# Patient Record
Sex: Female | Born: 1965 | Race: White | Hispanic: No | Marital: Married | Smoking: Never smoker
Health system: Southern US, Community
[De-identification: ages and names within clinical notes are randomized; demographics above are authoritative.]

## PROBLEM LIST (undated history)

## (undated) DIAGNOSIS — G5600 Carpal tunnel syndrome, unspecified upper limb: Secondary | ICD-10-CM

## (undated) DIAGNOSIS — R32 Unspecified urinary incontinence: Secondary | ICD-10-CM

## (undated) DIAGNOSIS — T753XXA Motion sickness, initial encounter: Secondary | ICD-10-CM

## (undated) DIAGNOSIS — M1990 Unspecified osteoarthritis, unspecified site: Secondary | ICD-10-CM

## (undated) DIAGNOSIS — K219 Gastro-esophageal reflux disease without esophagitis: Secondary | ICD-10-CM

## (undated) DIAGNOSIS — Z5189 Encounter for other specified aftercare: Secondary | ICD-10-CM

## (undated) HISTORY — PX: LEG SURGERY: SHX1003

## (undated) HISTORY — DX: Encounter for other specified aftercare: Z51.89

## (undated) HISTORY — PX: BREAST ENHANCEMENT SURGERY: SHX7

## (undated) HISTORY — PX: AUGMENTATION MAMMAPLASTY: SUR837

## (undated) HISTORY — PX: DILATION AND CURETTAGE OF UTERUS: SHX78

## (undated) HISTORY — PX: BARTHOLIN GLAND CYST EXCISION: SHX565

## (undated) HISTORY — DX: Unspecified urinary incontinence: R32

---

## 1999-10-22 ENCOUNTER — Other Ambulatory Visit: Admission: RE | Admit: 1999-10-22 | Discharge: 1999-10-22 | Payer: Self-pay | Admitting: Gynecology

## 2001-02-20 ENCOUNTER — Other Ambulatory Visit: Admission: RE | Admit: 2001-02-20 | Discharge: 2001-02-20 | Payer: Self-pay | Admitting: Gynecology

## 2002-04-12 ENCOUNTER — Other Ambulatory Visit: Admission: RE | Admit: 2002-04-12 | Discharge: 2002-04-12 | Payer: Self-pay | Admitting: Gynecology

## 2015-10-17 ENCOUNTER — Encounter: Payer: Self-pay | Admitting: *Deleted

## 2015-10-24 ENCOUNTER — Ambulatory Visit
Admission: RE | Admit: 2015-10-24 | Discharge: 2015-10-24 | Disposition: A | Discharge status: Home / Self Care | Payer: BLUE CROSS/BLUE SHIELD | Source: Ambulatory Visit | Attending: Unknown Physician Specialty | Admitting: Unknown Physician Specialty

## 2015-10-24 ENCOUNTER — Ambulatory Visit: Payer: BLUE CROSS/BLUE SHIELD | Admitting: Anesthesiology

## 2015-10-24 ENCOUNTER — Encounter
Admission: RE | Disposition: A | Discharge status: Home / Self Care | Payer: Self-pay | Source: Ambulatory Visit | Attending: Unknown Physician Specialty

## 2015-10-24 DIAGNOSIS — G5602 Carpal tunnel syndrome, left upper limb: Secondary | ICD-10-CM | POA: Insufficient documentation

## 2015-10-24 DIAGNOSIS — Z9889 Other specified postprocedural states: Secondary | ICD-10-CM | POA: Insufficient documentation

## 2015-10-24 DIAGNOSIS — Z8261 Family history of arthritis: Secondary | ICD-10-CM | POA: Diagnosis not present

## 2015-10-24 DIAGNOSIS — Z8249 Family history of ischemic heart disease and other diseases of the circulatory system: Secondary | ICD-10-CM | POA: Insufficient documentation

## 2015-10-24 HISTORY — DX: Motion sickness, initial encounter: T75.3XXA

## 2015-10-24 HISTORY — PX: TRIGGER FINGER RELEASE: SHX641

## 2015-10-24 HISTORY — DX: Carpal tunnel syndrome, unspecified upper limb: G56.00

## 2015-10-24 HISTORY — PX: CARPAL TUNNEL RELEASE: SHX101

## 2015-10-24 SURGERY — CARPAL TUNNEL RELEASE
Anesthesia: Monitor Anesthesia Care | Site: Hand | Laterality: Right | Wound class: Clean

## 2015-10-24 MED ORDER — FENTANYL CITRATE (PF) 100 MCG/2ML IJ SOLN
INTRAMUSCULAR | Status: DC | PRN
  Administered 2015-10-24 (×2): 50 ug via INTRAVENOUS
  Administered 2015-10-24: 100 ug via INTRAVENOUS

## 2015-10-24 MED ORDER — NORCO 5-325 MG PO TABS: 1.0000 | ORAL_TABLET | Freq: Four times a day (QID) | ORAL | Status: DC | PRN

## 2015-10-24 MED ORDER — LIDOCAINE HCL (PF) 1 % IJ SOLN
INTRAMUSCULAR | Status: DC | PRN
  Administered 2015-10-24: 10 mL

## 2015-10-24 MED ORDER — MEPERIDINE HCL 25 MG/ML IJ SOLN: 6.2500 mg | INTRAMUSCULAR | Status: DC | PRN

## 2015-10-24 MED ORDER — OXYCODONE HCL 5 MG PO TABS
5.0000 mg | ORAL_TABLET | Freq: Once | ORAL | Status: DC | PRN
Start: 2015-10-24 — End: 2015-10-24

## 2015-10-24 MED ORDER — LIDOCAINE HCL (CARDIAC) 20 MG/ML IV SOLN
INTRAVENOUS | Status: DC | PRN
  Administered 2015-10-24: 50 mg via INTRAVENOUS

## 2015-10-24 MED ORDER — HYDROMORPHONE HCL 1 MG/ML IJ SOLN: 0.2500 mg | INTRAMUSCULAR | Status: DC | PRN

## 2015-10-24 MED ORDER — OXYCODONE HCL 5 MG/5ML PO SOLN: 5.0000 mg | Freq: Once | ORAL | Status: DC | PRN

## 2015-10-24 MED ORDER — PROMETHAZINE HCL 25 MG/ML IJ SOLN: 6.2500 mg | INTRAMUSCULAR | Status: DC | PRN

## 2015-10-24 MED ORDER — PROPOFOL 500 MG/50ML IV EMUL
INTRAVENOUS | Status: DC | PRN
Start: 2015-10-24 — End: 2015-10-24
  Administered 2015-10-24: 120 ug/kg/min via INTRAVENOUS

## 2015-10-24 MED ORDER — ROPIVACAINE HCL 5 MG/ML IJ SOLN
INTRAMUSCULAR | Status: DC | PRN
  Administered 2015-10-24: 200 mg via EPIDURAL

## 2015-10-24 MED ORDER — LACTATED RINGERS IV SOLN
INTRAVENOUS | Status: DC
  Administered 2015-10-24: 08:00:00 via INTRAVENOUS

## 2015-10-24 MED ORDER — MIDAZOLAM HCL 2 MG/2ML IJ SOLN
INTRAMUSCULAR | Status: DC | PRN
  Administered 2015-10-24 (×2): 2 mg via INTRAVENOUS

## 2015-10-24 SURGICAL SUPPLY — 30 items
BANDAGE CONFORM 2X5YD N/S (GAUZE/BANDAGES/DRESSINGS) ×4 IMPLANT
BANDAGE ELASTIC 2 CLIP NS LF (GAUZE/BANDAGES/DRESSINGS) ×8 IMPLANT
BNDG ESMARK 4X12 TAN STRL LF (GAUZE/BANDAGES/DRESSINGS) ×4 IMPLANT
COVER LIGHT HANDLE FLEXIBLE (MISCELLANEOUS) ×8 IMPLANT
CUFF TOURN SGL QUICK 18 (TOURNIQUET CUFF) ×8 IMPLANT
DRAPE EENT SPLIT AURORA (DRAPES) ×4 IMPLANT
DRAPE SHEET LG 3/4 BI-LAMINATE (DRAPES) ×4 IMPLANT
DURAPREP 26ML APPLICATOR (WOUND CARE) ×8 IMPLANT
GAUZE SPONGE 4X4 12PLY STRL (GAUZE/BANDAGES/DRESSINGS) ×4 IMPLANT
GLOVE BIO SURGEON STRL SZ7.5 (GLOVE) ×8 IMPLANT
GLOVE BIO SURGEON STRL SZ8 (GLOVE) ×4 IMPLANT
GLOVE INDICATOR 8.0 STRL GRN (GLOVE) ×8 IMPLANT
GOWN STRL REUS W/ TWL LRG LVL3 (GOWN DISPOSABLE) ×4 IMPLANT
GOWN STRL REUS W/TWL LRG LVL3 (GOWN DISPOSABLE) ×6
KIT ROOM TURNOVER OR (KITS) ×4 IMPLANT
LOOP VESSEL RED MINI 1.3X0.9 (MISCELLANEOUS) IMPLANT
LOOPS RED MINI 1.3MMX0.9MM (MISCELLANEOUS)
NS IRRIG 500ML POUR BTL (IV SOLUTION) ×4 IMPLANT
PACK EXTREMITY ARMC (MISCELLANEOUS) ×4 IMPLANT
PAD GROUND ADULT SPLIT (MISCELLANEOUS) ×4 IMPLANT
PADDING CAST 2X4YD ST (MISCELLANEOUS) ×4
PADDING CAST BLEND 2X4 STRL (MISCELLANEOUS) ×4 IMPLANT
SOL PREP PVP 2OZ (MISCELLANEOUS) ×4
SOLUTION PREP PVP 2OZ (MISCELLANEOUS) ×2 IMPLANT
SPLINT CAST 1 STEP 3X12 (MISCELLANEOUS) ×4 IMPLANT
STOCKINETTE 4X48 STRL (DRAPES) ×8 IMPLANT
STRAP BODY AND KNEE 60X3 (MISCELLANEOUS) ×4 IMPLANT
SUT ETHILON 4-0 (SUTURE) ×8
SUT ETHILON 4-0 FS2 18XMFL BLK (SUTURE) ×4
SUTURE ETHLN 4-0 FS2 18XMF BLK (SUTURE) ×4 IMPLANT

## 2015-10-24 NOTE — Progress Notes (Signed)
Assisted Dr. Friedman with left, supraclavicular block. Side rails up, monitors on throughout procedure. See vital signs in flow sheet. Tolerated Procedure well.  

## 2015-10-24 NOTE — Anesthesia Postprocedure Evaluation (Signed)
Anesthesia Post Note  Patient: Hayley Jacobson  Procedure(s) Performed: Procedure(s) (LRB): LEFT OPEN CARPAL TUNNEL RELEASE (Left) RIGHT HAND MIDDLE RELEASE TRIGGER FINGER/A-1 PULLEY (Right)  Patient location during evaluation: PACU Anesthesia Type: Regional and MAC Level of consciousness: awake and alert and oriented Pain management: pain level controlled Vital Signs Assessment: post-procedure vital signs reviewed and stable Respiratory status: spontaneous breathing and nonlabored ventilation Cardiovascular status: stable Postop Assessment: no signs of nausea or vomiting and adequate PO intake Anesthetic complications: no    Estill Batten

## 2015-10-24 NOTE — H&P (Signed)
  H and P reviewed. No changes. Uploaded at later date. 

## 2015-10-24 NOTE — Op Note (Signed)
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 Leanor Kail, Brooke Bonito. M.D.  ANESTHESIA: Supraclavicular block for the left wrist surgery and local anesthesia for the right middle finger surgery   INDICATIONS FOR SURGERY: Hayley Jacobson is a 50 y.o. year old female with a long history of numbness and paresthesias in the left hand. Nerve conduction studies demonstrated findings consistent with significant  median nerve compression.The patient had not seen any significant improvement despite conservative nonsurgical intervention. After discussion of the risks and benefits of surgical intervention, the patient expressed understanding of the risks benefits and agree with plans for carpal tunnel release.   PROCEDURE IN DETAIL: The patient was brought into the operating room after a supraclavicular nerve block on the left upper extremity had been performed. A tourniquet was placed on the patient's left upper arm.The left hand and arm were prepped  and draped in the usual sterile fashion. A "time-out" was performed as per usual protocol. The hand and forearm were exsanguinated using an Esmarch and the tourniquet was inflated to 250 mmHg.  An incision was made just ulnar to the thenar palmar crease. Dissection was carried down through the palmar fascia to the transverse carpal ligament. The transverse carpal ligament was sharply incised, taking care to protect the underlying structures within the carpal tunnel. Complete release of the transverse carpal ligament was achieved. There was no evidence of a mass or proliferative synovitis within the carpal tunnel. The wound was irrigated with  saline. The tourniquet was released at this time. It had been up for about 9 minutes. Bleeding was controlled with coagulation cautery.. The skin was then re-approximated with interrupted sutures of #4-0 nylon. A sterile dressing was applied followed by application of a volar splint.  I then turned my attention to the right hand. The right hand was prepped and draped in usual fashion for a procedure about the right upper extremity. In this case the tourniquet was on the forearm since there was an IV in the antecubital fossa. The right hand and forearm were exsanguinated and the tourniquet was inflated. I made about a centimeter and a half incision over the volar aspect of the MCP joint of the right middle finger in the area that had previously been anesthetized with a local block consisting of 10 cc of 1% Xylocaine. I bluntly dissected down to the flexor sheath. Care was taken to protect the digital nerves. I then opened the stenosed portion of the flexor sheath longitudinally. It was felt that the stenotic segment was completely decompressed. The middle finger flexor tendon moved freely at this time with no catching or popping. The tourniquet was released at this time. It was up about 6 minutes. Bleeding was controlled with digital pressure. The wound was closed with 4-0 nylon sutures in vertical mattress fashion and then a sterile dressing was applied.  The patient tolerated the procedures well and was transported to the PACU in stable condition.  Dr. Mariel Kansky. M.D.

## 2015-10-24 NOTE — Discharge Instructions (Signed)
General Anesthesia, Adult, Care After Refer to this sheet in the next few weeks. These instructions provide you with information on caring for yourself after your procedure. Your health care provider may also give you more specific instructions. Your treatment has been planned according to current medical practices, but problems sometimes occur. Call your health care provider if you have any problems or questions after your procedure. WHAT TO EXPECT AFTER THE PROCEDURE After the procedure, it is typical to experience:  Sleepiness.  Nausea and vomiting. HOME CARE INSTRUCTIONS  For the first 24 hours after general anesthesia:  Have a responsible person with you.  Do not drive a car. If you are alone, do not take public transportation.  Do not drink alcohol.  Do not take medicine that has not been prescribed by your health care provider.  Do not sign important papers or make important decisions.  You may resume a normal diet and activities as directed by your health care provider.  Change bandages (dressings) as directed.  If you have questions or problems that seem related to general anesthesia, call the hospital and ask for the anesthetist or anesthesiologist on call. SEEK MEDICAL CARE IF:  You have nausea and vomiting that continue the day after anesthesia.  You develop a rash. SEEK IMMEDIATE MEDICAL CARE IF:   You have difficulty breathing.  You have chest pain.  You have any allergic problems.   This information is not intended to replace advice given to you by your health care provider. Make sure you discuss any questions you have with your health care provider.   Document Released: 12/13/2000 Document Revised: 09/27/2014 Document Reviewed: 01/05/2012 Elsevier Interactive Patient Education 2016 [employer]  Elevation  RTC in about 10 days  Ice pack  Keep wounds dry

## 2015-10-24 NOTE — Transfer of Care (Signed)
Immediate Anesthesia Transfer of Care Note  Patient: Hayley Jacobson  Procedure(s) Performed: Procedure(s): LEFT OPEN CARPAL TUNNEL RELEASE (Left) RIGHT HAND MIDDLE RELEASE TRIGGER FINGER/A-1 PULLEY (Right)  Patient Location: PACU  Anesthesia Type: Regional, MAC  Level of Consciousness: awake, alert  and patient cooperative  Airway and Oxygen Therapy: Patient Spontanous Breathing and Patient connected to supplemental oxygen  Post-op Assessment: Post-op Vital signs reviewed, Patient's Cardiovascular Status Stable, Respiratory Function Stable, Patent Airway and No signs of Nausea or vomiting  Post-op Vital Signs: Reviewed and stable  Complications: No apparent anesthesia complications

## 2015-10-24 NOTE — Anesthesia Procedure Notes (Addendum)
Anesthesia Regional Block:  Supraclavicular block  Pre-Anesthetic Checklist: ,, timeout performed, Correct Patient, Correct Site, Correct Laterality, Correct Procedure,, site marked, risks and benefits discussed, Surgical consent, Pre-op evaluation,  At surgeon's request  Laterality: Left  Prep: chloraprep       Needles:  Injection technique: Single-shot     Needle Length: 9cm 9 cm Needle Gauge: 21 and 21 G    Additional Needles: Supraclavicular block Narrative:  Start time: 10/24/2015 8:20 AM End time: 10/24/2015 8:25 AM Injection made incrementally with aspirations every 5 mL.  Performed by: Personally  Anesthesiologist: Estill Batten   Procedure Name: MAC Date/Time: 10/24/2015 9:30 AM Performed by: Cameron Ali Pre-anesthesia Checklist: Patient identified, Emergency Drugs available, Suction available, Timeout performed and Patient being monitored Patient Re-evaluated:Patient Re-evaluated prior to inductionOxygen Delivery Method: Nasal cannula Placement Confirmation: positive ETCO2

## 2015-10-24 NOTE — Anesthesia Preprocedure Evaluation (Signed)
Anesthesia Evaluation  Patient identified by MRN, date of birth, ID band  Reviewed: Allergy & Precautions, NPO status , Patient's Chart, lab work & pertinent test results, reviewed documented beta blocker date and time   Airway Mallampati: I  TM Distance: >3 FB Neck ROM: Full    Dental no notable dental hx.    Pulmonary neg pulmonary ROS,    Pulmonary exam normal        Cardiovascular negative cardio ROS Normal cardiovascular exam     Neuro/Psych  Neuromuscular disease    GI/Hepatic negative GI ROS, Neg liver ROS,   Endo/Other  negative endocrine ROS  Renal/GU negative Renal ROS  negative genitourinary   Musculoskeletal negative musculoskeletal ROS (+)   Abdominal   Peds negative pediatric ROS (+)  Hematology negative hematology ROS (+)   Anesthesia Other Findings   Reproductive/Obstetrics negative OB ROS                             Anesthesia Physical Anesthesia Plan  ASA: I  Anesthesia Plan: Regional and MAC   Post-op Pain Management:    Induction:   Airway Management Planned:   Additional Equipment:   Intra-op Plan:   Post-operative Plan:   Informed Consent: I have reviewed the patients History and Physical, chart, labs and discussed the procedure including the risks, benefits and alternatives for the proposed anesthesia with the patient or authorized representative who has indicated his/her understanding and acceptance.     Plan Discussed with: CRNA  Anesthesia Plan Comments:         Anesthesia Quick Evaluation

## 2015-10-27 ENCOUNTER — Encounter: Payer: Self-pay | Admitting: Unknown Physician Specialty

## 2016-05-04 ENCOUNTER — Other Ambulatory Visit: Payer: Self-pay | Admitting: Surgery

## 2016-05-04 DIAGNOSIS — M25511 Pain in right shoulder: Secondary | ICD-10-CM

## 2016-05-04 DIAGNOSIS — M7581 Other shoulder lesions, right shoulder: Secondary | ICD-10-CM

## 2016-05-18 ENCOUNTER — Ambulatory Visit
Admission: RE | Admit: 2016-05-18 | Discharge: 2016-05-18 | Disposition: A | Discharge status: Home / Self Care | Payer: BLUE CROSS/BLUE SHIELD | Source: Ambulatory Visit | Attending: Surgery | Admitting: Surgery

## 2016-05-18 DIAGNOSIS — M7591 Shoulder lesion, unspecified, right shoulder: Secondary | ICD-10-CM | POA: Diagnosis not present

## 2016-05-18 DIAGNOSIS — M25511 Pain in right shoulder: Secondary | ICD-10-CM | POA: Insufficient documentation

## 2016-05-18 DIAGNOSIS — M7581 Other shoulder lesions, right shoulder: Secondary | ICD-10-CM | POA: Insufficient documentation

## 2016-05-18 DIAGNOSIS — M19011 Primary osteoarthritis, right shoulder: Secondary | ICD-10-CM | POA: Diagnosis not present

## 2016-05-29 LAB — HM COLONOSCOPY

## 2016-09-02 ENCOUNTER — Encounter: Payer: Self-pay | Admitting: *Deleted

## 2016-09-08 ENCOUNTER — Ambulatory Visit
Admission: RE | Admit: 2016-09-08 | Discharge: 2016-09-08 | Disposition: A | Discharge status: Home / Self Care | Payer: Managed Care, Other (non HMO) | Source: Ambulatory Visit | Attending: Surgery | Admitting: Surgery

## 2016-09-08 ENCOUNTER — Ambulatory Visit: Payer: Managed Care, Other (non HMO) | Admitting: Anesthesiology

## 2016-09-08 ENCOUNTER — Encounter
Admission: RE | Disposition: A | Discharge status: Home / Self Care | Payer: Self-pay | Source: Ambulatory Visit | Attending: Surgery

## 2016-09-08 DIAGNOSIS — M65811 Other synovitis and tenosynovitis, right shoulder: Secondary | ICD-10-CM | POA: Insufficient documentation

## 2016-09-08 DIAGNOSIS — Z8249 Family history of ischemic heart disease and other diseases of the circulatory system: Secondary | ICD-10-CM | POA: Diagnosis not present

## 2016-09-08 DIAGNOSIS — Z888 Allergy status to other drugs, medicaments and biological substances status: Secondary | ICD-10-CM | POA: Diagnosis not present

## 2016-09-08 DIAGNOSIS — M659 Synovitis and tenosynovitis, unspecified: Secondary | ICD-10-CM | POA: Diagnosis present

## 2016-09-08 DIAGNOSIS — M7541 Impingement syndrome of right shoulder: Secondary | ICD-10-CM | POA: Insufficient documentation

## 2016-09-08 DIAGNOSIS — Z8261 Family history of arthritis: Secondary | ICD-10-CM | POA: Insufficient documentation

## 2016-09-08 HISTORY — PX: SHOULDER ARTHROSCOPY: SHX128

## 2016-09-08 SURGERY — ARTHROSCOPY, SHOULDER
Anesthesia: Monitor Anesthesia Care | Laterality: Right | Wound class: Clean

## 2016-09-08 MED ORDER — MIDAZOLAM HCL 2 MG/2ML IJ SOLN
INTRAMUSCULAR | Status: DC | PRN
  Administered 2016-09-08: 2 mg via INTRAVENOUS

## 2016-09-08 MED ORDER — LIDOCAINE HCL (CARDIAC) 20 MG/ML IV SOLN
INTRAVENOUS | Status: DC | PRN
  Administered 2016-09-08: 20 mg via INTRAVENOUS

## 2016-09-08 MED ORDER — LACTATED RINGERS IV SOLN
INTRAVENOUS | Status: DC
  Administered 2016-09-08: 12:00:00 via INTRAVENOUS

## 2016-09-08 MED ORDER — BUPIVACAINE-EPINEPHRINE 0.25% -1:200000 IJ SOLN
INTRAMUSCULAR | Status: DC | PRN
  Administered 2016-09-08: 30 mL

## 2016-09-08 MED ORDER — OXYCODONE HCL 5 MG PO TABS: 5.0000 mg | ORAL_TABLET | ORAL | 0 refills | Status: DC | PRN

## 2016-09-08 MED ORDER — SCOPOLAMINE 1 MG/3DAYS TD PT72
1.0000 | MEDICATED_PATCH | Freq: Once | TRANSDERMAL | Status: DC
  Administered 2016-09-08: 1.5 mg via TRANSDERMAL

## 2016-09-08 MED ORDER — FENTANYL CITRATE (PF) 100 MCG/2ML IJ SOLN
INTRAMUSCULAR | Status: DC | PRN
  Administered 2016-09-08: 100 ug via INTRAVENOUS

## 2016-09-08 MED ORDER — DEXTROSE 5 % IV SOLN
2000.0000 mg | Freq: Once | INTRAVENOUS | Status: AC
  Administered 2016-09-08: 2000 mg via INTRAVENOUS

## 2016-09-08 MED ORDER — PROPOFOL 500 MG/50ML IV EMUL
INTRAVENOUS | Status: DC | PRN
  Administered 2016-09-08: 100 ug/kg/min via INTRAVENOUS

## 2016-09-08 MED ORDER — ROPIVACAINE HCL 5 MG/ML IJ SOLN
INTRAMUSCULAR | Status: DC | PRN
  Administered 2016-09-08: 35 mL via PERINEURAL

## 2016-09-08 SURGICAL SUPPLY — 36 items
BIT DRILL JUGRKNT W/NDL BIT2.9 (DRILL) IMPLANT
BLADE FULL RADIUS 3.5 (BLADE) ×3 IMPLANT
BUR ACROMIONIZER 4.0 (BURR) ×3 IMPLANT
CANNULA SHAVER 8MMX76MM (CANNULA) ×3 IMPLANT
CHLORAPREP W/TINT 26ML (MISCELLANEOUS) ×3 IMPLANT
COVER LIGHT HANDLE UNIVERSAL (MISCELLANEOUS) ×6 IMPLANT
COVER MAYO STAND STRL (DRAPES) ×3 IMPLANT
DRAPE IMP U-DRAPE 54X76 (DRAPES) ×6 IMPLANT
DRILL JUGGERKNOT W/NDL BIT 2.9 (DRILL)
GAUZE PETRO XEROFOAM 1X8 (MISCELLANEOUS) ×3 IMPLANT
GAUZE SPONGE 4X4 12PLY STRL (GAUZE/BANDAGES/DRESSINGS) ×3 IMPLANT
GLOVE BIO SURGEON STRL SZ8 (GLOVE) ×6 IMPLANT
GLOVE INDICATOR 8.0 STRL GRN (GLOVE) ×3 IMPLANT
GOWN STRL REUS W/ TWL LRG LVL3 (GOWN DISPOSABLE) ×1 IMPLANT
GOWN STRL REUS W/ TWL XL LVL3 (GOWN DISPOSABLE) ×1 IMPLANT
GOWN STRL REUS W/TWL LRG LVL3 (GOWN DISPOSABLE) ×3
GOWN STRL REUS W/TWL XL LVL3 (GOWN DISPOSABLE) ×3
IV LACTATED RINGER IRRG 3000ML (IV SOLUTION) ×3
IV LR IRRIG 3000ML ARTHROMATIC (IV SOLUTION) ×1 IMPLANT
MANIFOLD 4PT FOR NEPTUNE1 (MISCELLANEOUS) ×3 IMPLANT
MAT BLUE FLOOR 46X72 FLO (MISCELLANEOUS) ×3 IMPLANT
NEEDLE HYPO 21X1.5 SAFETY (NEEDLE) ×3 IMPLANT
NEEDLE REVERSE CUT 1/2 CRC (NEEDLE) ×3 IMPLANT
PACK ARTHROSCOPY SHOULDER (MISCELLANEOUS) ×3 IMPLANT
PAD GROUND ADULT SPLIT (MISCELLANEOUS) ×3 IMPLANT
SLING ARM M TX990204 (SOFTGOODS) ×3 IMPLANT
STAPLER SKIN PROX 35W (STAPLE) ×3 IMPLANT
STRAP BODY AND KNEE 60X3 (MISCELLANEOUS) ×6 IMPLANT
SUT ETHIBOND 0 MO6 C/R (SUTURE) IMPLANT
SUT VIC AB 2-0 CT1 27 (SUTURE)
SUT VIC AB 2-0 CT1 TAPERPNT 27 (SUTURE) IMPLANT
TAPE MICROFOAM 4IN (TAPE) ×3 IMPLANT
TUBING ARTHRO INFLOW-ONLY STRL (TUBING) ×3 IMPLANT
TUBING CONNECTING 10 (TUBING) ×2 IMPLANT
TUBING CONNECTING 10' (TUBING) ×1
WAND HAND CNTRL MULTIVAC 90 (MISCELLANEOUS) ×3 IMPLANT

## 2016-09-08 NOTE — Anesthesia Procedure Notes (Signed)
Anesthesia Regional Block:  Interscalene brachial plexus block  Pre-Anesthetic Checklist: ,, timeout performed, Correct Patient, Correct Site, Correct Laterality, Correct Procedure, Correct Position, site marked, Risks and benefits discussed, Surgical consent,  Pre-op evaluation,  At surgeon's request  Laterality: Right  Prep: chloraprep       Needles:  Injection technique: Single-shot  Needle Type: Stimiplex     Needle Length: 10cm 10 cm Needle Gauge: 21 and 21 G    Additional Needles:  Procedures: ultrasound guided (picture in chart) Interscalene brachial plexus block Narrative:  Start time: 09/08/2016 11:34 AM End time: 09/08/2016 11:41 AM Injection made incrementally with aspirations every 5 mL.  Performed by: Personally  Anesthesiologist: Ronelle Nigh  Additional Notes: Functioning IV was confirmed and monitors applied. Ultrasound guidance: relevant anatomy identified, needle position confirmed, local anesthetic spread visualized around nerve(s)., vascular puncture avoided.  Image printed for medical record.  Negative aspiration and no paresthesias; incremental administration of local anesthetic. The patient tolerated the procedure well. Vitals signes recorded in RN notes.

## 2016-09-08 NOTE — Anesthesia Procedure Notes (Signed)
Procedure Name: MAC Performed by: Kalika Smay Pre-anesthesia Checklist: Patient identified, Emergency Drugs available, Suction available, Timeout performed and Patient being monitored Patient Re-evaluated:Patient Re-evaluated prior to inductionOxygen Delivery Method: Simple face mask Placement Confirmation: positive ETCO2       

## 2016-09-08 NOTE — Progress Notes (Signed)
Assisted Mike Stella ANMD with right, ultrasound guided, supraclavicular block. Side rails up, monitors on throughout procedure. See vital signs in flow sheet. Tolerated Procedure well.  

## 2016-09-08 NOTE — Transfer of Care (Signed)
Immediate Anesthesia Transfer of Care Note  Patient: Hayley Jacobson  Procedure(s) Performed: Procedure(s): ARTHROSCOPY SHOULDER WITH DEBRIDEMENT, DECOMPRESSION (Right)  Patient Location: PACU  Anesthesia Type: MAC, Regional  Level of Consciousness: awake, alert  and patient cooperative  Airway and Oxygen Therapy: Patient Spontanous Breathing and Patient connected to supplemental oxygen  Post-op Assessment: Post-op Vital signs reviewed, Patient's Cardiovascular Status Stable, Respiratory Function Stable, Patent Airway and No signs of Nausea or vomiting  Post-op Vital Signs: Reviewed and stable  Complications: No apparent anesthesia complications

## 2016-09-08 NOTE — Anesthesia Preprocedure Evaluation (Signed)
Anesthesia Evaluation  Patient identified by MRN, date of birth, ID band Patient awake    Reviewed: Allergy & Precautions, H&P , NPO status , Patient's Chart, lab work & pertinent test results  Airway Mallampati: I  TM Distance: >3 FB Neck ROM: full    Dental no notable dental hx.    Pulmonary    Pulmonary exam normal        Cardiovascular Normal cardiovascular exam     Neuro/Psych    GI/Hepatic   Endo/Other    Renal/GU      Musculoskeletal   Abdominal   Peds  Hematology   Anesthesia Other Findings   Reproductive/Obstetrics                             Anesthesia Physical Anesthesia Plan  ASA: I  Anesthesia Plan: MAC and Regional   Post-op Pain Management:    Induction:   Airway Management Planned:   Additional Equipment:   Intra-op Plan:   Post-operative Plan:   Informed Consent: I have reviewed the patients History and Physical, chart, labs and discussed the procedure including the risks, benefits and alternatives for the proposed anesthesia with the patient or authorized representative who has indicated his/her understanding and acceptance.     Plan Discussed with:   Anesthesia Plan Comments:         Anesthesia Quick Evaluation

## 2016-09-08 NOTE — H&P (Signed)
Paper H&P to be scanned into permanent record. H&P reviewed. No changes. 

## 2016-09-08 NOTE — Anesthesia Postprocedure Evaluation (Signed)
Anesthesia Post Note  Patient: Hayley Jacobson  Procedure(s) Performed: Procedure(s) (LRB): ARTHROSCOPY SHOULDER WITH DEBRIDEMENT, DECOMPRESSION (Right)  Patient location during evaluation: PACU Anesthesia Type: Regional and MAC Level of consciousness: awake and alert and oriented Pain management: satisfactory to patient Vital Signs Assessment: post-procedure vital signs reviewed and stable Respiratory status: spontaneous breathing, nonlabored ventilation and respiratory function stable Cardiovascular status: blood pressure returned to baseline and stable Postop Assessment: Adequate PO intake and No signs of nausea or vomiting Anesthetic complications: no    Raliegh Ip

## 2016-09-08 NOTE — Discharge Instructions (Addendum)
General Anesthesia, Adult, Care After These instructions provide you with information about caring for yourself after your procedure. Your health care provider may also give you more specific instructions. Your treatment has been planned according to current medical practices, but problems sometimes occur. Call your health care provider if you have any problems or questions after your procedure. What can I expect after the procedure? After the procedure, it is common to have:  Vomiting.  A sore throat.  Mental slowness. It is common to feel:  Nauseous.  Cold or shivery.  Sleepy.  Tired.  Sore or achy, even in parts of your body where you did not have surgery. Follow these instructions at home: For at least 24 hours after the procedure:  Do not:  Participate in activities where you could fall or become injured.  Drive.  Use heavy machinery.  Drink alcohol.  Take sleeping pills or medicines that cause drowsiness.  Make important decisions or sign legal documents.  Take care of children on your own.  Rest. Eating and drinking  If you vomit, drink water, juice, or soup when you can drink without vomiting.  Drink enough fluid to keep your urine clear or pale yellow.  Make sure you have little or no nausea before eating solid foods.  Follow the diet recommended by your health care provider. General instructions  Have a responsible adult stay with you until you are awake and alert.  Return to your normal activities as told by your health care provider. Ask your health care provider what activities are safe for you.  Take over-the-counter and prescription medicines only as told by your health care provider.  If you smoke, do not smoke without supervision.  Keep all follow-up visits as told by your health care provider. This is important. Contact a health care provider if:  You continue to have nausea or vomiting at home, and medicines are not helpful.  You  cannot drink fluids or start eating again.  You cannot urinate after 8-12 hours.  You develop a skin rash.  You have fever.  You have increasing redness at the site of your procedure. Get help right away if:  You have difficulty breathing.  You have chest pain.  You have unexpected bleeding.  You feel that you are having a life-threatening or urgent problem. This information is not intended to replace advice given to you by your health care provider. Make sure you discuss any questions you have with your health care provider. Document Released: 12/13/2000 Document Revised: 02/09/2016 Document Reviewed: 08/21/2015 Elsevier Interactive Patient Education  2017 [place] dressing dry and intact.  May shower after dressing changed on post-op day #4 (Sunday).  Cover staples with Band-Aids after drying off. Apply ice frequently to shoulder. Take ibuprofen 600-800 mg TID with meals for 7-10 days, then as necessary. Take oxycodone as prescribed when needed.  May supplement with ES Tylenol if necessary. Keep shoulder sling on for first 3 days, then as needed for comfort. Follow-up in 10-14 days or as scheduled.

## 2016-09-08 NOTE — Op Note (Signed)
09/08/2016  1:51 PM  Patient:   Hayley Jacobson  Pre-Op Diagnosis:   Impingement/tendinopathy, right shoulder.  Postoperative diagnosis: Impingement/tendinopathy with synovitis, right shoulder.  Procedure: Limited arthroscopic debridement and arthroscopic subacromial decompression, right shoulder.  Anesthesia: IV sedation with interscalene block placed preoperatively by the anesthesiologist.  Surgeon:   Pascal Lux, MD  Assistant:   None  Findings: As above. The biceps tendon demonstrated an area of mild focal erythema but otherwise was intact. The labrum demonstrated minimal fraying anteriorly but also was otherwise intact. The rotator cuff was in satisfactory condition, as were the articular surfaces of both the glenoid and humerus.  Complications: None  Fluids:   900 cc  Estimated blood loss: 5 cc  Tourniquet time: None  Drains: None  Closure: Staples   Brief clinical note: The patient is a 50 year old female with a history of right shoulder pain. The patient's symptoms have progressed despite medications, activity modification, etc. The patient's history and examination are consistent with impingement/tendinopathy. A recent MRI scan demonstrated no evidence for partial full-thickness tears of the rotator cuff. The patient presents at this time for arthroscopy, debridement, and subacromial decompression.  Procedure: The patient underwent placement of an interscalene block by the anesthesiologist in the preoperative holding area before she was brought into the operating room and lain in the supine position. After adequate IV sedation was achieved, the patient was repositioned in the beach chair position using the beach chair positioner. The right shoulder and upper extremity were prepped with ChloraPrep solution before being draped sterilely. Preoperative antibiotics were administered. A timeout was performed to confirm the proper surgical site  before the expected portal sites and incision site were injected with 0.25% Sensorcaine with epinephrine. A posterior portal was created and the glenohumeral joint thoroughly inspected with the findings as described above. An anterior portal was created using an outside-in technique. The labrum and rotator cuff were further probed, again confirming the above-noted findings. Areas of extensive synovitis anteriorly and superiorly were debrided back to stable margins using the full-radius resector, as was the small area of focal fraying of the anterior portion of the labrum. The ArthroCare wand was inserted and used to obtain hemostasis as well as to "anneal" the labrum superiorly and anteriorly. The instruments were removed from the joint after suctioning the excess fluid.  The camera was repositioned through the posterior portal into the subacromial space. A separate lateral portal was created using an outside-in technique. The 3.5 mm full-radius resector was introduced and used to perform a subtotal bursectomy. The ArthroCare wand was then inserted and used to remove the periosteal tissue off the undersurface of the anterior third of the acromion as well as to recess the coracoacromial ligament from its attachment along the anterior and lateral margins of the acromion. The 4.0 mm acromionizing bur was introduced and used to complete the decompression by removing the undersurface of the anterior third of the acromion. The full radius resector was reintroduced to remove any residual bony debris before the ArthroCare wand was reintroduced to obtain hemostasis. The instruments were then removed from the subacromial space after suctioning the excess fluid.  The portal sites were closed using staples. A sterile bulky dressing was applied to the shoulder before the arm was placed into a standard shoulder sling. The patient was then awakened, extubated, and returned to the recovery room in satisfactory condition after  tolerating the procedure well.

## 2016-09-09 ENCOUNTER — Encounter: Payer: Self-pay | Admitting: Surgery

## 2018-06-28 ENCOUNTER — Other Ambulatory Visit: Payer: Self-pay | Admitting: Obstetrics and Gynecology

## 2018-06-28 DIAGNOSIS — R2231 Localized swelling, mass and lump, right upper limb: Secondary | ICD-10-CM

## 2018-07-04 ENCOUNTER — Other Ambulatory Visit: Payer: Self-pay | Admitting: Obstetrics and Gynecology

## 2018-07-04 ENCOUNTER — Ambulatory Visit
Admission: RE | Admit: 2018-07-04 | Discharge: 2018-07-04 | Disposition: A | Discharge status: Home / Self Care | Payer: 59 | Source: Ambulatory Visit | Attending: Obstetrics and Gynecology | Admitting: Obstetrics and Gynecology

## 2018-07-04 ENCOUNTER — Ambulatory Visit
Admission: RE | Admit: 2018-07-04 | Discharge: 2018-07-04 | Disposition: A | Discharge status: Home / Self Care | Payer: Managed Care, Other (non HMO) | Source: Ambulatory Visit | Attending: Obstetrics and Gynecology | Admitting: Obstetrics and Gynecology

## 2018-07-04 DIAGNOSIS — R2231 Localized swelling, mass and lump, right upper limb: Secondary | ICD-10-CM

## 2019-01-24 ENCOUNTER — Other Ambulatory Visit: Payer: Self-pay | Admitting: Surgery

## 2019-01-24 DIAGNOSIS — M25512 Pain in left shoulder: Secondary | ICD-10-CM

## 2019-02-05 ENCOUNTER — Other Ambulatory Visit: Payer: Self-pay

## 2019-02-05 ENCOUNTER — Ambulatory Visit
Admission: RE | Admit: 2019-02-05 | Discharge: 2019-02-05 | Disposition: A | Discharge status: Home / Self Care | Payer: 59 | Source: Ambulatory Visit | Attending: Surgery | Admitting: Surgery

## 2019-02-05 DIAGNOSIS — M25512 Pain in left shoulder: Secondary | ICD-10-CM | POA: Insufficient documentation

## 2019-03-15 ENCOUNTER — Encounter
Admission: RE | Admit: 2019-03-15 | Discharge: 2019-03-15 | Disposition: A | Discharge status: Home / Self Care | Payer: 59 | Source: Ambulatory Visit | Attending: Surgery | Admitting: Surgery

## 2019-03-15 ENCOUNTER — Other Ambulatory Visit: Payer: Self-pay

## 2019-03-15 DIAGNOSIS — Z1159 Encounter for screening for other viral diseases: Secondary | ICD-10-CM | POA: Diagnosis not present

## 2019-03-15 DIAGNOSIS — Z01812 Encounter for preprocedural laboratory examination: Secondary | ICD-10-CM | POA: Insufficient documentation

## 2019-03-15 HISTORY — DX: Unspecified osteoarthritis, unspecified site: M19.90

## 2019-03-15 HISTORY — DX: Gastro-esophageal reflux disease without esophagitis: K21.9

## 2019-03-15 NOTE — Patient Instructions (Signed)
Your procedure is scheduled on: 03-20-19 TUESDAY Report to Same Day Surgery 2nd floor medical mall [facility] Entrance-take elevator on left to 2nd floor.  Check in with surgery information desk.) To find out your arrival time please call [phone] between 1PM - 3PM on 03-19-19 MONDAY  Remember: Instructions that are not followed completely may result in serious medical risk, up to and including death, or upon the discretion of your surgeon and anesthesiologist your surgery may need to be rescheduled.    _x___ 1. Do not eat food after midnight the night before your procedure. NO GUM OR CANDY AFTER MIDNIGHT. You may drink clear liquids up to 2 hours before you are scheduled to arrive at the hospital for your procedure.  Do not drink clear liquids within 2 hours of your scheduled arrival to the hospital.  Clear liquids include  --Water or Apple juice without pulp  --Clear carbohydrate beverage such as ClearFast or Gatorade  --Black Coffee or Clear Tea (No milk, no creamers, do not add anything to the coffee or Tea   ____Ensure clear carbohydrate drink on the way to the hospital for bariatric patients  ____Ensure clear carbohydrate drink 3 hours before surgery for Dr Dwyane Luo patients if physician instructed.    __x__ 2. No Alcohol for 24 hours before or after surgery.   __x__3. No Smoking or e-cigarettes for 24 prior to surgery.  Do not use any chewable tobacco products for at least 6 hour prior to surgery   ____  4. Bring all medications with you on the day of surgery if instructed.    __x__ 5. Notify your doctor if there is any change in your medical condition     (cold, fever, infections).    x___6. On the morning of surgery brush your teeth with toothpaste and water.  You may rinse your mouth with mouth wash if you wish.  Do not swallow any toothpaste or mouthwash.   Do not wear jewelry, make-up, hairpins, clips or nail polish.  Do not wear lotions, powders, or perfumes. You  may wear deodorant.  Do not shave 48 hours prior to surgery. Men may shave face and neck.  Do not bring valuables to the hospital.    [facility] is not responsible for any belongings or valuables.               Contacts, dentures or bridgework may not be worn into surgery.  Leave your suitcase in the car. After surgery it may be brought to your room.  For patients admitted to the hospital, discharge time is determined by your treatment team.  _  Patients discharged the day of surgery will not be allowed to drive home.  You will need someone to drive you home and stay with you the night of your procedure.    Please read over the following fact sheets that you were given:   [facility] Preparing for Surgery  ____ Take anti-hypertensive listed below, cardiac, seizure, asthma, anti-reflux and psychiatric medicines. These include:  1. NONE  2.  3.  4.  5.  6.  ____Fleets enema or Magnesium Citrate as directed.   _x___ Use CHG Soap or sage wipes as directed on instruction sheet   ____ Use inhalers on the day of surgery and bring to hospital day of surgery  ____ Stop Metformin and Janumet 2 days prior to surgery.    ____ Take 1/2 of usual insulin dose the night before surgery and none on the morning  surgery.   ____ Follow recommendations from Cardiologist, Pulmonologist or PCP regarding stopping Aspirin, Coumadin, Plavix ,Eliquis, Effient, or Pradaxa, and Pletal.  X____Stop Anti-inflammatories such as Advil, Aleve, Ibuprofen, Motrin, Naproxen, Naprosyn, Goodies powders or aspirin products NOW-OK to take Tylenol   _x___ Stop supplements until after surgery-STOP JUICE PLUS NOW-MAY RESUME AFTER SURGERY   ____ Bring C-Pap to the hospital.

## 2019-03-16 ENCOUNTER — Other Ambulatory Visit: Payer: Self-pay

## 2019-03-16 ENCOUNTER — Other Ambulatory Visit
Admission: RE | Admit: 2019-03-16 | Discharge: 2019-03-16 | Disposition: A | Discharge status: Home / Self Care | Payer: 59 | Source: Ambulatory Visit | Attending: Surgery | Admitting: Surgery

## 2019-03-16 DIAGNOSIS — Z01812 Encounter for preprocedural laboratory examination: Secondary | ICD-10-CM | POA: Diagnosis not present

## 2019-03-17 LAB — NOVEL CORONAVIRUS, NAA (HOSP ORDER, SEND-OUT TO REF LAB; TAT 18-24 HRS): SARS-CoV-2, NAA: NOT DETECTED

## 2019-03-19 MED ORDER — CEFAZOLIN SODIUM-DEXTROSE 2-4 GM/100ML-% IV SOLN
2.0000 g | Freq: Once | INTRAVENOUS | Status: AC
  Administered 2019-03-20: 2 g via INTRAVENOUS

## 2019-03-20 ENCOUNTER — Ambulatory Visit: Payer: No Typology Code available for payment source | Admitting: Anesthesiology

## 2019-03-20 ENCOUNTER — Other Ambulatory Visit: Payer: Self-pay

## 2019-03-20 ENCOUNTER — Ambulatory Visit
Admission: RE | Admit: 2019-03-20 | Discharge: 2019-03-20 | Disposition: A | Discharge status: Home / Self Care | Payer: No Typology Code available for payment source | Source: Ambulatory Visit | Attending: Surgery | Admitting: Surgery

## 2019-03-20 ENCOUNTER — Encounter: Payer: Self-pay | Admitting: Certified Registered Nurse Anesthetist

## 2019-03-20 ENCOUNTER — Encounter
Admission: RE | Disposition: A | Discharge status: Home / Self Care | Payer: Self-pay | Source: Ambulatory Visit | Attending: Surgery

## 2019-03-20 DIAGNOSIS — M7502 Adhesive capsulitis of left shoulder: Secondary | ICD-10-CM | POA: Diagnosis not present

## 2019-03-20 DIAGNOSIS — Z79899 Other long term (current) drug therapy: Secondary | ICD-10-CM | POA: Insufficient documentation

## 2019-03-20 DIAGNOSIS — K219 Gastro-esophageal reflux disease without esophagitis: Secondary | ICD-10-CM | POA: Insufficient documentation

## 2019-03-20 DIAGNOSIS — M25812 Other specified joint disorders, left shoulder: Secondary | ICD-10-CM | POA: Diagnosis not present

## 2019-03-20 HISTORY — PX: SHOULDER ARTHROSCOPY WITH SUBACROMIAL DECOMPRESSION: SHX5684

## 2019-03-20 SURGERY — SHOULDER ARTHROSCOPY WITH SUBACROMIAL DECOMPRESSION
Anesthesia: General | Site: Shoulder | Laterality: Left

## 2019-03-20 MED ORDER — LIDOCAINE HCL (PF) 1 % IJ SOLN
INTRAMUSCULAR | Status: DC | PRN
  Administered 2019-03-20: 3 mL

## 2019-03-20 MED ORDER — MIDAZOLAM HCL 2 MG/2ML IJ SOLN
INTRAMUSCULAR | Status: DC | PRN
  Administered 2019-03-20: 2 mg via INTRAVENOUS

## 2019-03-20 MED ORDER — LABETALOL HCL 5 MG/ML IV SOLN
INTRAVENOUS | Status: AC
  Administered 2019-03-20: 10 mg via INTRAVENOUS
  Filled 2019-03-20: qty 4

## 2019-03-20 MED ORDER — SODIUM CHLORIDE 0.9 % IV SOLN
INTRAVENOUS | Status: DC | PRN
  Administered 2019-03-20: 20 ug/min via INTRAVENOUS

## 2019-03-20 MED ORDER — LIDOCAINE HCL (PF) 2 % IJ SOLN
INTRAMUSCULAR | Status: AC
  Filled 2019-03-20: qty 10

## 2019-03-20 MED ORDER — ONDANSETRON HCL 4 MG/2ML IJ SOLN
INTRAMUSCULAR | Status: AC
  Filled 2019-03-20: qty 2

## 2019-03-20 MED ORDER — FENTANYL CITRATE (PF) 100 MCG/2ML IJ SOLN
50.0000 ug | Freq: Once | INTRAMUSCULAR | Status: AC
  Administered 2019-03-20: 14:00:00 50 ug via INTRAVENOUS

## 2019-03-20 MED ORDER — ROCURONIUM BROMIDE 50 MG/5ML IV SOLN
INTRAVENOUS | Status: AC
  Filled 2019-03-20: qty 1

## 2019-03-20 MED ORDER — SUGAMMADEX SODIUM 200 MG/2ML IV SOLN
INTRAVENOUS | Status: AC
  Filled 2019-03-20: qty 2

## 2019-03-20 MED ORDER — PROPOFOL 10 MG/ML IV BOLUS
INTRAVENOUS | Status: AC
  Filled 2019-03-20: qty 20

## 2019-03-20 MED ORDER — BUPIVACAINE LIPOSOME 1.3 % IJ SUSP
INTRAMUSCULAR | Status: AC
  Filled 2019-03-20: qty 20

## 2019-03-20 MED ORDER — MIDAZOLAM HCL 2 MG/2ML IJ SOLN
INTRAMUSCULAR | Status: AC
  Administered 2019-03-20: 14:00:00 1 mg via INTRAVENOUS
  Filled 2019-03-20: qty 2

## 2019-03-20 MED ORDER — BUPIVACAINE HCL (PF) 0.5 % IJ SOLN
INTRAMUSCULAR | Status: DC | PRN
  Administered 2019-03-20: 10 mL via PERINEURAL

## 2019-03-20 MED ORDER — BUPIVACAINE-EPINEPHRINE (PF) 0.5% -1:200000 IJ SOLN
INTRAMUSCULAR | Status: AC
  Filled 2019-03-20: qty 30

## 2019-03-20 MED ORDER — MIDAZOLAM HCL 2 MG/2ML IJ SOLN
INTRAMUSCULAR | Status: AC
  Filled 2019-03-20: qty 2

## 2019-03-20 MED ORDER — BUPIVACAINE LIPOSOME 1.3 % IJ SUSP
INTRAMUSCULAR | Status: DC | PRN
  Administered 2019-03-20: 20 mL via PERINEURAL

## 2019-03-20 MED ORDER — OXYCODONE HCL 5 MG PO TABS: 5.0000 mg | ORAL_TABLET | ORAL | 0 refills | Status: DC | PRN

## 2019-03-20 MED ORDER — PROMETHAZINE HCL 25 MG/ML IJ SOLN: 6.2500 mg | INTRAMUSCULAR | Status: DC | PRN

## 2019-03-20 MED ORDER — MEPERIDINE HCL 50 MG/ML IJ SOLN: 6.2500 mg | INTRAMUSCULAR | Status: DC | PRN

## 2019-03-20 MED ORDER — MIDAZOLAM HCL 2 MG/2ML IJ SOLN
1.0000 mg | Freq: Once | INTRAMUSCULAR | Status: AC
  Administered 2019-03-20: 1 mg via INTRAVENOUS

## 2019-03-20 MED ORDER — FENTANYL CITRATE (PF) 100 MCG/2ML IJ SOLN
INTRAMUSCULAR | Status: AC
Start: 2019-03-20 — End: ?
  Filled 2019-03-20: qty 2

## 2019-03-20 MED ORDER — LIDOCAINE HCL (PF) 1 % IJ SOLN
INTRAMUSCULAR | Status: AC
  Filled 2019-03-20: qty 5

## 2019-03-20 MED ORDER — LIDOCAINE HCL 4 % MT SOLN
OROMUCOSAL | Status: DC | PRN
  Administered 2019-03-20: 4 mL via TOPICAL

## 2019-03-20 MED ORDER — FENTANYL CITRATE (PF) 100 MCG/2ML IJ SOLN
INTRAMUSCULAR | Status: AC
  Administered 2019-03-20: 50 ug via INTRAVENOUS
  Filled 2019-03-20: qty 2

## 2019-03-20 MED ORDER — PROPOFOL 10 MG/ML IV BOLUS
INTRAVENOUS | Status: DC | PRN
  Administered 2019-03-20: 110 mg via INTRAVENOUS

## 2019-03-20 MED ORDER — EPINEPHRINE PF 1 MG/ML IJ SOLN
INTRAMUSCULAR | Status: DC | PRN
  Administered 2019-03-20: 1 mg

## 2019-03-20 MED ORDER — BUPIVACAINE-EPINEPHRINE (PF) 0.5% -1:200000 IJ SOLN
INTRAMUSCULAR | Status: DC | PRN
  Administered 2019-03-20: 30 mL

## 2019-03-20 MED ORDER — FENTANYL CITRATE (PF) 100 MCG/2ML IJ SOLN: 25.0000 ug | INTRAMUSCULAR | Status: DC | PRN

## 2019-03-20 MED ORDER — CEFAZOLIN SODIUM-DEXTROSE 2-4 GM/100ML-% IV SOLN
INTRAVENOUS | Status: AC
  Filled 2019-03-20: qty 100

## 2019-03-20 MED ORDER — LACTATED RINGERS IV SOLN
INTRAVENOUS | Status: DC
  Administered 2019-03-20: 14:00:00 via INTRAVENOUS

## 2019-03-20 MED ORDER — SUGAMMADEX SODIUM 200 MG/2ML IV SOLN
INTRAVENOUS | Status: DC | PRN
  Administered 2019-03-20: 200 mg via INTRAVENOUS

## 2019-03-20 MED ORDER — LIDOCAINE HCL (CARDIAC) PF 100 MG/5ML IV SOSY
PREFILLED_SYRINGE | INTRAVENOUS | Status: DC | PRN
  Administered 2019-03-20: 60 mg via INTRAVENOUS

## 2019-03-20 MED ORDER — DEXAMETHASONE SODIUM PHOSPHATE 10 MG/ML IJ SOLN
INTRAMUSCULAR | Status: AC
  Filled 2019-03-20: qty 1

## 2019-03-20 MED ORDER — OXYCODONE HCL 5 MG PO TABS: 5.0000 mg | ORAL_TABLET | Freq: Once | ORAL | Status: DC | PRN

## 2019-03-20 MED ORDER — FAMOTIDINE 20 MG PO TABS: 20.0000 mg | ORAL_TABLET | Freq: Once | ORAL | Status: DC

## 2019-03-20 MED ORDER — FENTANYL CITRATE (PF) 100 MCG/2ML IJ SOLN
INTRAMUSCULAR | Status: DC | PRN
  Administered 2019-03-20: 50 ug via INTRAVENOUS

## 2019-03-20 MED ORDER — ONDANSETRON HCL 4 MG/2ML IJ SOLN
INTRAMUSCULAR | Status: DC | PRN
  Administered 2019-03-20: 4 mg via INTRAVENOUS

## 2019-03-20 MED ORDER — LABETALOL HCL 5 MG/ML IV SOLN
10.0000 mg | Freq: Once | INTRAVENOUS | Status: AC
  Administered 2019-03-20: 17:00:00 10 mg via INTRAVENOUS

## 2019-03-20 MED ORDER — ROCURONIUM BROMIDE 100 MG/10ML IV SOLN
INTRAVENOUS | Status: DC | PRN
  Administered 2019-03-20: 50 mg via INTRAVENOUS

## 2019-03-20 MED ORDER — EPINEPHRINE PF 1 MG/ML IJ SOLN
INTRAMUSCULAR | Status: AC
  Filled 2019-03-20: qty 1

## 2019-03-20 MED ORDER — DEXAMETHASONE SODIUM PHOSPHATE 10 MG/ML IJ SOLN
INTRAMUSCULAR | Status: DC | PRN
  Administered 2019-03-20: 8 mg via INTRAVENOUS

## 2019-03-20 MED ORDER — PHENYLEPHRINE HCL (PRESSORS) 10 MG/ML IV SOLN
INTRAVENOUS | Status: DC | PRN
  Administered 2019-03-20 (×2): 100 ug via INTRAVENOUS
  Administered 2019-03-20: 50 ug via INTRAVENOUS
  Administered 2019-03-20: 150 ug via INTRAVENOUS

## 2019-03-20 MED ORDER — OXYCODONE HCL 5 MG/5ML PO SOLN: 5.0000 mg | Freq: Once | ORAL | Status: DC | PRN

## 2019-03-20 MED ORDER — BUPIVACAINE HCL (PF) 0.5 % IJ SOLN
INTRAMUSCULAR | Status: AC
  Filled 2019-03-20: qty 10

## 2019-03-20 SURGICAL SUPPLY — 44 items
APL PRP STRL LF DISP 70% ISPRP (MISCELLANEOUS) ×1
BIT DRILL JUGRKNT W/NDL BIT2.9 (DRILL) IMPLANT
BLADE FULL RADIUS 3.5 (BLADE) ×3 IMPLANT
BUR ACROMIONIZER 4.0 (BURR) ×3 IMPLANT
CANNULA SHAVER 8MMX76MM (CANNULA) ×3 IMPLANT
CHLORAPREP W/TINT 26 (MISCELLANEOUS) ×3 IMPLANT
COVER MAYO STAND STRL (DRAPES) ×3 IMPLANT
COVER WAND RF STERILE (DRAPES) ×3 IMPLANT
DRAPE IMP U-DRAPE 54X76 (DRAPES) ×6 IMPLANT
DRILL JUGGERKNOT W/NDL BIT 2.9 (DRILL)
DRSG OPSITE POSTOP 3X4 (GAUZE/BANDAGES/DRESSINGS) ×6 IMPLANT
ELECT REM PT RETURN 9FT ADLT (ELECTROSURGICAL) ×3
ELECTRODE REM PT RTRN 9FT ADLT (ELECTROSURGICAL) ×1 IMPLANT
GAUZE SPONGE 4X4 12PLY STRL (GAUZE/BANDAGES/DRESSINGS) ×3 IMPLANT
GAUZE XEROFORM 1X8 LF (GAUZE/BANDAGES/DRESSINGS) ×3 IMPLANT
GLOVE BIO SURGEON STRL SZ7.5 (GLOVE) ×6 IMPLANT
GLOVE BIO SURGEON STRL SZ8 (GLOVE) ×6 IMPLANT
GLOVE BIOGEL PI IND STRL 8 (GLOVE) ×1 IMPLANT
GLOVE BIOGEL PI INDICATOR 8 (GLOVE) ×2
GLOVE INDICATOR 8.0 STRL GRN (GLOVE) ×3 IMPLANT
GOWN STRL REUS W/ TWL LRG LVL3 (GOWN DISPOSABLE) ×1 IMPLANT
GOWN STRL REUS W/ TWL XL LVL3 (GOWN DISPOSABLE) ×1 IMPLANT
GOWN STRL REUS W/TWL LRG LVL3 (GOWN DISPOSABLE) ×2
GOWN STRL REUS W/TWL XL LVL3 (GOWN DISPOSABLE) ×3
GRASPER SUT 15 45D LOW PRO (SUTURE) IMPLANT
IV LACTATED RINGER IRRG 3000ML (IV SOLUTION) ×3
IV LR IRRIG 3000ML ARTHROMATIC (IV SOLUTION) ×1 IMPLANT
MANIFOLD NEPTUNE II (INSTRUMENTS) ×3 IMPLANT
MASK FACE SPIDER DISP (MASK) ×3 IMPLANT
MAT ABSORB  FLUID 56X50 GRAY (MISCELLANEOUS) ×2
MAT ABSORB FLUID 56X50 GRAY (MISCELLANEOUS) ×1 IMPLANT
NEEDLE HYPO 22GX1.5 SAFETY (NEEDLE) ×3 IMPLANT
PACK ARTHROSCOPY SHOULDER (MISCELLANEOUS) ×3 IMPLANT
SLING ARM LRG DEEP (SOFTGOODS) ×1 IMPLANT
SLING ARM M TX990204 (SOFTGOODS) ×2 IMPLANT
SLING ULTRA II LG (MISCELLANEOUS) ×1 IMPLANT
STAPLER SKIN PROX 35W (STAPLE) ×3 IMPLANT
STRAP SAFETY 5IN WIDE (MISCELLANEOUS) ×3 IMPLANT
SUT ETHIBOND 0 MO6 C/R (SUTURE) ×3 IMPLANT
SUT VIC AB 2-0 CT1 27 (SUTURE) ×6
SUT VIC AB 2-0 CT1 TAPERPNT 27 (SUTURE) ×2 IMPLANT
TAPE MICROFOAM 4IN (TAPE) ×3 IMPLANT
TUBING ARTHRO INFLOW-ONLY STRL (TUBING) ×3 IMPLANT
WAND WEREWOLF FLOW 90D (MISCELLANEOUS) ×3 IMPLANT

## 2019-03-20 NOTE — Op Note (Signed)
03/20/2019  3:34 PM  Patient:   Hayley Jacobson  Pre-Op Diagnosis:   Impingement/tendinopathy, left shoulder.  Post-Op Diagnosis:   Impingement/tendinopathy with primary adhesive capsulitis, left shoulder.  Procedure:   Limited arthroscopic debridement, arthroscopic subacromial decompression, and manipulation under anesthesia, left shoulder.  Anesthesia:   General endotracheal with interscalene block using Exparel placed preoperatively by the anesthesiologist.  Surgeon:   Pascal Lux, MD  Assistant:   None  Findings:   As above. Prior to manipulation, the left shoulder could be forward flexed to 145 and abducted to 135. At 90 of abduction, the shoulder could be externally rotated to 70 and internally rotated to 50. Following manipulation, the shoulder could be forward flexed to 165, abducted to 160 and, at 90 of abduction, externally rotated to 90 and internally rotated to 65. The labrum was in satisfactory condition, as were the articular surfaces of the glenoid and humerus. There was mild articular sided tearing of the anterior insertional fibers of the supraspinatus tendon, involving less than 10% of the footprint. The remainder the rotator cuff was in excellent condition. The biceps tendon also appear to be in excellent condition.  Complications:   None  Fluids:   900 cc  Estimated blood loss:   3 cc  Tourniquet time:   None  Drains:   None  Closure:   Staples      Brief clinical note:   The patient is a 53 year old female with a history of progressively worsening left shoulder pain. The patient's symptoms have progressed despite medications, activity modification, etc. The patient's history and examination are consistent with impingement/tendinopathy with a rotator cuff tear. These findings were confirmed by MRI scan. The patient presents at this time for definitive management of these shoulder symptoms.  Procedure:   The patient underwent placement of an  interscalene block using Exparel by the anesthesiologist in the preoperative holding area before being brought into the operating room and lain in the supine position. The patient then underwent general endotracheal intubation and anesthesia before being repositioned in the beach chair position using the beach chair positioner. A timeout was performed to confirm the proper surgical site before the arm was placed through a range of motion. The left shoulder was gently manipulated in both abduction and external rotation, as well as adduction and internal rotation. Several palpable and audible pops were heard as the scar tissue released, permitting full range of motion of the shoulder.   The left shoulder and upper extremity were prepped with ChloraPrep solution before being draped sterilely. Preoperative antibiotics were administered. The expected portal sites and incision site were injected with 0.5% Sensorcaine with epinephrine. A posterior portal was created and the glenohumeral joint thoroughly inspected with the findings as described above. An anterior portal was created using an outside-in technique. The labrum and rotator cuff were further probed, again confirming the above-noted findings.  Mild labral fraying centrally of the superior and posterior superior portions of the labrum were debrided using the full-radius resector, as were areas of synovitis anteriorly.  In addition, the area of partial-thickness tearing involving the anterior insertional fibers of the supraspinatus was debrided back to stable margins using the full-radius resector. The biceps tendon was probed and found to be in excellent condition. Given that the patient had primary case of capsulitis and that the biceps tendon appeared to be in such good condition, it was felt best not to proceed with a biceps tenodesis in order to permit the patient to resume full range  of motion as soon as possible after surgery. The ArthroCare wand was  inserted and used to obtain hemostasis as well as to "anneal" the labrum superiorly and anteriorly. The instruments were removed from the joint after suctioning the excess fluid.  The camera was repositioned through the posterior portal into the subacromial space. A separate lateral portal was created using an outside-in technique. The 3.5 mm full-radius resector was introduced and used to perform a subtotal bursectomy. The ArthroCare wand was then inserted and used to remove the periosteal tissue off the undersurface of the anterior third of the acromion as well as to recess the coracoacromial ligament from its attachment along the anterior and lateral margins of the acromion. The 4.0 mm acromionizing bur was introduced and used to complete the decompression by removing the undersurface of the anterior third of the acromion. The full radius resector was reintroduced to remove any residual bony debris before the ArthroCare wand was reintroduced to obtain hemostasis. The instruments were then removed from the subacromial space after suctioning the excess fluid.  The portal sites were closed using staples.  Sterile occlusive dressings were applied to the shoulder before the arm was placed into a shoulder sling. The patient was then awakened, extubated, and returned to the recovery room in satisfactory condition after tolerating the procedure well.

## 2019-03-20 NOTE — Anesthesia Procedure Notes (Signed)
Procedure Name: Intubation Date/Time: 03/20/2019 2:26 PM Performed by: Eben Burow, CRNA Pre-anesthesia Checklist: Patient identified, Emergency Drugs available, Suction available and Patient being monitored Patient Re-evaluated:Patient Re-evaluated prior to induction Oxygen Delivery Method: Circle system utilized Preoxygenation: Pre-oxygenation with 100% oxygen Induction Type: IV induction Ventilation: Mask ventilation without difficulty Laryngoscope Size: Miller and 2 Grade View: Grade I Tube type: Oral Tube size: 7.0 mm Number of attempts: 1 Airway Equipment and Method: Stylet and LTA kit utilized Placement Confirmation: ETT inserted through vocal cords under direct vision,  positive ETCO2 and breath sounds checked- equal and bilateral Secured at: 20 cm Tube secured with: Tape Dental Injury: Teeth and Oropharynx as per pre-operative assessment

## 2019-03-20 NOTE — Anesthesia Procedure Notes (Addendum)
Anesthesia Regional Block: Interscalene brachial plexus block   Pre-Anesthetic Checklist: ,, timeout performed, Correct Patient, Correct Site, Correct Laterality, Correct Procedure, Correct Position, site marked, Risks and benefits discussed,  Surgical consent,  Pre-op evaluation,  At surgeon's request and post-op pain management  Laterality: Left  Prep: chloraprep       Needles:  Injection technique: Single-shot  Needle Type: Stimiplex     Needle Length: 10cm  Needle Gauge: 21     Additional Needles:   Procedures:,,,, ultrasound used (permanent image in chart),,,,  Narrative:  Start time: 03/20/2019 1:35 PM End time: 03/20/2019 1:40 PM Injection made incrementally with aspirations every 5 mL.  Performed by: Personally  Anesthesiologist: Emmie Niemann, MD  Additional Notes: Functioning IV was confirmed and monitors were applied.  A Stimuplex needle was used. Sterile prep and drape,hand hygiene and sterile gloves were used.  Negative aspiration and negative test dose prior to incremental administration of local anesthetic. The patient tolerated the procedure well.

## 2019-03-20 NOTE — Progress Notes (Signed)
Pt states that she feels like something in back of throat  A little redness in throat   Ice chips given and dr Kayleen Memos in to see pt

## 2019-03-20 NOTE — Progress Notes (Signed)
Continues to have DBP of mid to upper 90's

## 2019-03-20 NOTE — H&P (Signed)
Paper H&P to be scanned into permanent record. H&P reviewed and patient re-examined. No changes. 

## 2019-03-20 NOTE — Anesthesia Post-op Follow-up Note (Signed)
Anesthesia QCDR form completed.        

## 2019-03-20 NOTE — Discharge Instructions (Addendum)
AMBULATORY SURGERY  DISCHARGE INSTRUCTIONS   1) The drugs that you were given will stay in your system until tomorrow so for the next 24 hours you should not:  A) Drive an automobile B) Make any legal decisions C) Drink any alcoholic beverage   2) You may resume regular meals tomorrow.  Today it is better to start with liquids and gradually work up to solid foods.  You may eat anything you prefer, but it is better to start with liquids, then soup and crackers, and gradually work up to solid foods.   3) Please notify your doctor immediately if you have any unusual bleeding, trouble breathing, redness and pain at the surgery site, drainage, fever, or pain not relieved by medication.    4) Additional Instructions:        Please contact your physician with any problems or Same Day Surgery at [phone], Monday through Friday 6 am to 4 pm, or [place] at [facility] number at [phone].   Interscalene Nerve Block with Exparel  1.  For your surgery you have received an Interscalene Nerve Block with Exparel. 2. Nerve Blocks affect many types of nerves, including nerves that control movement, pain and normal sensation.  You may experience feelings such as numbness, tingling, heaviness, weakness or the inability to move your arm or the feeling or sensation that your arm has "fallen asleep". 3. A nerve block with Exparel can last up to 5 days.  Usually the weakness wears off first.  The tingling and heaviness usually wear off next.  Finally you may start to notice pain.  Keep in mind that this may occur in any order.  Once a nerve block starts to wear off it is usually completely gone within 60 minutes. 4. ISNB may cause mild shortness of breath, a hoarse voice, blurry vision, unequal pupils, or drooping of the face on the same side as the nerve block.  These symptoms will usually resolve with the numbness.  Very rarely the procedure itself can cause mild seizures. 5. If needed,  your surgeon will give you a prescription for pain medication.  It will take about 60 minutes for the oral pain medication to become fully effective.  So, it is recommended that you start taking this medication before the nerve block first begins to wear off, or when you first begin to feel discomfort. 6. Take your pain medication only as prescribed.  Pain medication can cause sedation and decrease your breathing if you take more than you need for the level of pain that you have. 7. Nausea is a common side effect of many pain medications.  You may want to eat something before taking your pain medicine to prevent nausea. 8. After an Interscalene nerve block, you cannot feel pain, pressure or extremes in temperature in the effected arm.  Because your arm is numb it is at an increased risk for injury.  To decrease the possibility of injury, please practice the following:  a. While you are awake change the position of your arm frequently to prevent too much pressure on any one area for prolonged periods of time. b.  If you have a cast or tight dressing, check the color or your fingers every couple of hours.  Call your surgeon with the appearance of any discoloration (white or blue). c. If you are given a sling to wear before you go home, please wear it  at all times until the block has completely worn off.  Do not  get up at night without your sling. d. Please contact [facility] or your surgeon if you do not begin to regain sensation after 7 days from the surgery.  Anesthesia may be contacted by calling the Same Day Surgery Department, Mon. through Fri., 6 am to 4 pm at [phone].   e. If you experience any other problems or concerns, please contact your surgeon's office. If you experience severe or prolonged shortness of breath go to the nearest emergency department.     Orthopedic discharge instructions: May shower with intact OpSite dressings.  Use sling regularly for the next 2 to 3 days  until the block wears off, then may use sling as necessary for comfort. May remove sling for bathing purposes. May remove sling and progress in activities as tolerated once the block has worn off fully. Apply ice frequently to shoulder. Take ibuprofen 600-800 mg TID with meals for 7-10 days, then as necessary. Take oxycodone as prescribed when needed.  May supplement with ES Tylenol if necessary. Follow-up in 10-14 days or as scheduled.

## 2019-03-20 NOTE — Anesthesia Preprocedure Evaluation (Signed)
Anesthesia Evaluation  Patient identified by MRN, date of birth, ID band Patient awake    Reviewed: Allergy & Precautions, NPO status , Patient's Chart, lab work & pertinent test results  History of Anesthesia Complications Negative for: history of anesthetic complications  Airway Mallampati: II  TM Distance: >3 FB Neck ROM: Full    Dental no notable dental hx.    Pulmonary neg pulmonary ROS, neg sleep apnea, neg COPD,    breath sounds clear to auscultation- rhonchi (-) wheezing      Cardiovascular (-) hypertension(-) CAD, (-) Past MI, (-) Cardiac Stents and (-) CABG  Rhythm:Regular Rate:Normal - Systolic murmurs and - Diastolic murmurs    Neuro/Psych neg Seizures negative neurological ROS  negative psych ROS   GI/Hepatic Neg liver ROS, GERD  ,  Endo/Other  negative endocrine ROSneg diabetes  Renal/GU negative Renal ROS     Musculoskeletal  (+) Arthritis ,   Abdominal (+) - obese,   Peds  Hematology negative hematology ROS (+)   Anesthesia Other Findings Past Medical History: No date: Arthritis No date: Carpal tunnel syndrome     Comment:  left No date: GERD (gastroesophageal reflux disease)     Comment:  rare No date: Motion sickness     Comment:  boats   Reproductive/Obstetrics                             Anesthesia Physical Anesthesia Plan  ASA: II  Anesthesia Plan: General   Post-op Pain Management:  Regional for Post-op pain   Induction: Intravenous  PONV Risk Score and Plan: 2 and Ondansetron, Dexamethasone and Midazolam  Airway Management Planned: Oral ETT  Additional Equipment:   Intra-op Plan:   Post-operative Plan: Extubation in OR  Informed Consent: I have reviewed the patients History and Physical, chart, labs and discussed the procedure including the risks, benefits and alternatives for the proposed anesthesia with the patient or authorized representative  who has indicated his/her understanding and acceptance.     Dental advisory given  Plan Discussed with: CRNA and Anesthesiologist  Anesthesia Plan Comments:         Anesthesia Quick Evaluation

## 2019-03-20 NOTE — Transfer of Care (Signed)
Immediate Anesthesia Transfer of Care Note  Patient: Hayley Jacobson  Procedure(s) Performed: SHOULDER ARTHROSCOPY WITH DEBRIDEMENT AND SUBACROMIAL DECOMPRESSION (Left Shoulder)  Patient Location: PACU  Anesthesia Type:General  Level of Consciousness: drowsy  Airway & Oxygen Therapy: Patient Spontanous Breathing and Patient connected to face mask oxygen  Post-op Assessment: Report given to RN and Post -op Vital signs reviewed and stable  Post vital signs: Reviewed and stable  Last Vitals:  Vitals Value Taken Time  BP 133/76 03/20/19 1529  Temp 36.3 C 03/20/19 1529  Pulse 84 03/20/19 1530  Resp 12 03/20/19 1530  SpO2 100 % 03/20/19 1530  Vitals shown include unvalidated device data.  Last Pain:  Vitals:   03/20/19 1258  TempSrc: Temporal  PainSc: 0-No pain         Complications: No apparent anesthesia complications

## 2019-03-22 NOTE — Anesthesia Postprocedure Evaluation (Signed)
Anesthesia Post Note  Patient: Hayley Jacobson  Procedure(s) Performed: Limited arthroscopic debridement, arthroscopic subacromial decompression, and manipulation under anesthesia, left shoulder (Left Shoulder)  Patient location during evaluation: PACU Anesthesia Type: General Level of consciousness: awake and alert and oriented Pain management: pain level controlled Vital Signs Assessment: post-procedure vital signs reviewed and stable Respiratory status: spontaneous breathing Cardiovascular status: blood pressure returned to baseline Anesthetic complications: no     Last Vitals:  Vitals:   03/20/19 1703 03/20/19 1731  BP: (!) 150/89 139/76  Pulse: 79 71  Resp: 20 20  Temp: (!) 36.1 C   SpO2: 100% 97%    Last Pain:  Vitals:   03/20/19 1703  TempSrc: Temporal  PainSc: 0-No pain                 Marinna Blane

## 2019-11-24 ENCOUNTER — Other Ambulatory Visit: Payer: Self-pay

## 2019-11-24 ENCOUNTER — Ambulatory Visit: Payer: 59 | Attending: Internal Medicine

## 2019-11-24 DIAGNOSIS — Z23 Encounter for immunization: Secondary | ICD-10-CM | POA: Insufficient documentation

## 2019-11-24 NOTE — Progress Notes (Signed)
   Covid-19 Vaccination Clinic  Name:  Hayley Jacobson    MRN: [id number] DOB: 1966-07-20  11/24/2019  [name] was observed post Covid-19 immunization for 15 minutes without incident. She was provided with Vaccine Information Sheet and instruction to access the V-Safe system.   [name] was instructed to call 911 with any severe reactions post vaccine: Marland Kitchen Difficulty breathing  . Swelling of face and throat  . A fast heartbeat  . A bad rash all over body  . Dizziness and weakness   Immunizations Administered    Name Date Dose VIS Date Route   Moderna COVID-19 Vaccine 11/24/2019  4:14 PM 0.5 mL 08/21/2019 Intramuscular   Manufacturer: Moderna   Lot: [id number]   [id number]

## 2019-12-22 ENCOUNTER — Ambulatory Visit: Payer: 59 | Attending: Internal Medicine

## 2019-12-22 DIAGNOSIS — Z23 Encounter for immunization: Secondary | ICD-10-CM

## 2019-12-22 NOTE — Progress Notes (Signed)
   Covid-19 Vaccination Clinic  Name:  Hayley Jacobson    MRN: [id number] DOB: 12-31-1965  12/22/2019  [name] was observed post Covid-19 immunization for 15 minutes without incident. She was provided with Vaccine Information Sheet and instruction to access the V-Safe system.   [name] was instructed to call 911 with any severe reactions post vaccine: Marland Kitchen Difficulty breathing  . Swelling of face and throat  . A fast heartbeat  . A bad rash all over body  . Dizziness and weakness   Immunizations Administered    Name Date Dose VIS Date Route   Moderna COVID-19 Vaccine 12/22/2019  8:42 AM 0.5 mL 08/21/2019 Intramuscular   Manufacturer: Levan Hurst   [id number]   [id number]

## 2020-05-20 ENCOUNTER — Encounter: Payer: 59 | Admitting: Dermatology

## 2020-05-29 LAB — BASIC METABOLIC PANEL
BUN: 15 (ref 4–21)
Creatinine: 0.8 (ref 0.5–1.1)
Glucose: 79
Potassium: 4.7 (ref 3.4–5.3)
Sodium: 143 (ref 137–147)

## 2020-05-29 LAB — HM MAMMOGRAPHY: HM Mammogram: NORMAL (ref 0–4)

## 2020-05-29 LAB — LIPID PANEL
Cholesterol: 221 — AB (ref 0–200)
HDL: 65 (ref 35–70)
LDL Cholesterol: 147
Triglycerides: 52 (ref 40–160)

## 2020-05-29 LAB — TSH: TSH: 0.98 (ref 0.41–5.90)

## 2020-11-26 ENCOUNTER — Other Ambulatory Visit: Payer: Self-pay

## 2020-11-26 ENCOUNTER — Encounter: Payer: Self-pay | Admitting: Internal Medicine

## 2020-11-26 ENCOUNTER — Ambulatory Visit (INDEPENDENT_AMBULATORY_CARE_PROVIDER_SITE_OTHER): Payer: No Typology Code available for payment source | Admitting: Internal Medicine

## 2020-11-26 ENCOUNTER — Ambulatory Visit
Admission: RE | Admit: 2020-11-26 | Discharge: 2020-11-26 | Disposition: A | Discharge status: Home / Self Care | Payer: No Typology Code available for payment source | Source: Ambulatory Visit | Attending: Internal Medicine | Admitting: Internal Medicine

## 2020-11-26 VITALS — BP 110/74 | HR 75 | Temp 97.6°F | Resp 14 | Ht 60.0 in | Wt 140.6 lb

## 2020-11-26 DIAGNOSIS — Z23 Encounter for immunization: Secondary | ICD-10-CM

## 2020-11-26 DIAGNOSIS — E559 Vitamin D deficiency, unspecified: Secondary | ICD-10-CM | POA: Diagnosis not present

## 2020-11-26 DIAGNOSIS — M79604 Pain in right leg: Secondary | ICD-10-CM

## 2020-11-26 DIAGNOSIS — Z8739 Personal history of other diseases of the musculoskeletal system and connective tissue: Secondary | ICD-10-CM | POA: Diagnosis not present

## 2020-11-26 LAB — COMPREHENSIVE METABOLIC PANEL
ALT: 12 U/L (ref 0–35)
AST: 17 U/L (ref 0–37)
Albumin: 4.6 g/dL (ref 3.5–5.2)
Alkaline Phosphatase: 52 U/L (ref 39–117)
BUN: 17 mg/dL (ref 6–23)
CO2: 29 mEq/L (ref 19–32)
Calcium: 9.8 mg/dL (ref 8.4–10.5)
Chloride: 104 mEq/L (ref 96–112)
Creatinine, Ser: 0.83 mg/dL (ref 0.40–1.20)
GFR: 78.79 mL/min (ref >60.00)
Glucose, Bld: 89 mg/dL (ref 70–99)
Potassium: 4.4 mEq/L (ref 3.5–5.1)
Sodium: 138 mEq/L (ref 135–145)
Total Bilirubin: 0.6 mg/dL (ref 0.2–1.2)
Total Protein: 7.4 g/dL (ref 6.0–8.3)

## 2020-11-26 LAB — CBC WITH DIFFERENTIAL/PLATELET
Basophils Absolute: 0 10*3/uL (ref 0.0–0.1)
Basophils Relative: 0.7 % (ref 0.0–3.0)
Eosinophils Absolute: 0.1 10*3/uL (ref 0.0–0.7)
Eosinophils Relative: 1.7 % (ref 0.0–5.0)
HCT: 43.3 % (ref 36.0–46.0)
Hemoglobin: 14.2 g/dL (ref 12.0–15.0)
Lymphocytes Relative: 32.7 % (ref 12.0–46.0)
Lymphs Abs: 1.5 10*3/uL (ref 0.7–4.0)
MCHC: 32.9 g/dL (ref 30.0–36.0)
MCV: 84.9 fl (ref 78.0–100.0)
Monocytes Absolute: 0.4 10*3/uL (ref 0.1–1.0)
Monocytes Relative: 8.5 % (ref 3.0–12.0)
Neutro Abs: 2.5 10*3/uL (ref 1.4–7.7)
Neutrophils Relative %: 56.4 % (ref 43.0–77.0)
Platelets: 236 10*3/uL (ref 150.0–400.0)
RBC: 5.1 Mil/uL (ref 3.87–5.11)
RDW: 13.2 % (ref 11.5–15.5)
WBC: 4.4 10*3/uL (ref 4.0–10.5)

## 2020-11-26 LAB — SEDIMENTATION RATE: Sed Rate: 4 mm/hr (ref 0–30)

## 2020-11-26 LAB — D-DIMER, QUANTITATIVE: D-Dimer, Quant: 0.25 mcg/mL FEU (ref <0.50)

## 2020-11-26 NOTE — Progress Notes (Signed)
Subjective:  Patient ID: Hayley Jacobson, female    DOB: Oct 05, 1965  Age: 55 y.o. MRN: [id number]  CC: The primary encounter diagnosis was Leg pain, diffuse, right. Diagnoses of Vitamin D deficiency, History of osteomyelitis as a child, and Need for shingles vaccine were also pertinent to this visit.  HPI Bonetta Mostek presents for   1) ESTABLISHMENT OF CARE 2) EVALUATION  OF RIGHT LEG PAIN   This visit occurred during the SARS-CoV-2 public health emergency.  Safety protocols were in place, including screening questions prior to the visit, additional usage of staff PPE, and extensive cleaning of exam room while observing appropriate contact time as indicated for disinfecting solutions.   Health 55 yr old female presents with cc of RIGHT LEG PAIN that has been present [place] .  Described as worsening over the last 3 months,  Now  WAKING HER UP THROBBING.  Started In the anterior shin, becomes more pronounced/worse as the day progresses, and by the time she is home from work the popliteal area and inner thigh start throbbing and is keepin her up at night.  No history of trauma or unusual activity.  Denies swelling,  Chest pain shortness of breath.  Does not exercise regulalry .    History of HRT for years,  Stopped ORAL ESTROGEN in  MAY 2021 . Works on a Armed forces training and education officer at [employer],  Walks 12, 000 steps daily , no sob no chest pain.  No history of DVT , but brother has had a DVT and early CAD  Takes D and calcium  .  History of d deficiency in the past treated with megadose   PMH:  2 surgical biopsies  For PMB  And a cervical polyp (?)  both benign  ([place], gyn) bilateral shoulder surgeries History of blood transfusion in 1992 post partum.  HIV negative .  E x  husband gave her vaginal herpes   History Phyllis has a past medical history of Arthritis, Blood transfusion without reported diagnosis, Carpal tunnel syndrome, GERD (gastroesophageal reflux disease),  Motion sickness, and Urine incontinence.   She has a past surgical history that includes Bartholin gland cyst excision; Breast enhancement surgery; Leg Surgery (Right); Dilation and curettage of uterus; Carpal tunnel release (Left, 10/24/2015); Trigger finger release (Right, 10/24/2015); Shoulder arthroscopy (Right, 09/08/2016); Augmentation mammaplasty (Bilateral); and Shoulder arthroscopy with subacromial decompression (Left, 03/20/2019).   Her family history includes CAD in her brother, father, and paternal grandfather.She reports that she has never smoked. She has never used smokeless tobacco. She reports current alcohol use of about 1.0 standard drink of alcohol per week. She reports that she does not use drugs.  Outpatient Medications Prior to Visit  Medication Sig Dispense Refill  . Calcium Carbonate-Vitamin D (CALTRATE 600+D PO) Take 1 tablet by mouth daily.    . Cholecalciferol (VITAMIN D3) 50 MCG (2000 UT) CAPS Take 1 capsule by mouth daily.    Marland Kitchen ESTRADIOL VA Place 1 Applicatorful vaginally 2 (two) times a week.    . Nutritional Supplements (JUICE PLUS FIBRE PO) Take 2 tablets by mouth 2 (two) times a day.     . zinc gluconate 50 MG tablet Take 50 mg by mouth daily.    Marland Kitchen acetaminophen (TYLENOL) 500 MG tablet Take 1,000 mg by mouth every 8 (eight) hours as needed for moderate pain.  (Patient not taking: Reported on 11/26/2020)    . Calcium-Magnesium-Vitamin D (CALCIUM 1200+D3 PO) Take 1 tablet by mouth daily. (Patient not taking:  Reported on 11/26/2020)    . Estradiol-Norethindrone Acet 0.5-0.1 MG tablet Take 1 tablet by mouth daily. (Patient not taking: Reported on 11/26/2020)    . Flaxseed, Linseed, (GROUND FLAX SEEDS PO) Take 1 Scoop by mouth as needed. Will occ add to shake (Patient not taking: Reported on 11/26/2020)    . Multiple Vitamin (MULTIVITAMIN WITH MINERALS) TABS tablet Take 1 tablet by mouth daily. (Patient not taking: Reported on 11/26/2020)    . oxyCODONE (ROXICODONE) 5 MG immediate  release tablet Take 1-2 tablets (5-10 mg total) by mouth every 4 (four) hours as needed for severe pain. (Patient not taking: Reported on 11/26/2020) 40 tablet 0  . PREBIOTIC PRODUCT PO Take 1 tablet by mouth daily. (Patient not taking: Reported on 11/26/2020)    . Probiotic Product (PROBIOTIC DAILY PO) Take 1-2 capsules by mouth daily. (Patient not taking: Reported on 11/26/2020)    . vitamin B-12 (CYANOCOBALAMIN) 1000 MCG tablet Take 1,000 mcg by mouth daily. (Patient not taking: Reported on 11/26/2020)     No facility-administered medications prior to visit.    Review of Systems:  Patient denies headache, fevers, malaise, unintentional weight loss, skin rash, eye pain, sinus congestion and sinus pain, sore throat, dysphagia,  hemoptysis , cough, dyspnea, wheezing, chest pain, palpitations, orthopnea, edema, abdominal pain, nausea, melena, diarrhea, constipation, flank pain, dysuria, hematuria, urinary  Frequency, nocturia, numbness, tingling, seizures,  Focal weakness, Loss of consciousness,  Tremor, insomnia, depression, anxiety, and suicidal ideation.     Objective:  BP 110/74 (BP Location: Left Arm, Patient Position: Sitting, Cuff Size: Normal)   Pulse 75   Temp 97.6 F (36.4 C) (Oral)   Resp 14   Ht 5' (1.524 m)   Wt 140 lb 9.6 oz (63.8 kg)   SpO2 98%   BMI 27.46 kg/m   Physical Exam:  General appearance: alert, cooperative and appears stated age Ears: normal TM's and external ear canals both ears Throat: lips, mucosa, and tongue normal; teeth and gums normal Neck: no adenopathy, no carotid bruit, supple, symmetrical, trachea midline and thyroid not enlarged, symmetric, no tenderness/mass/nodules Back: symmetric, no curvature. ROM normal. No CVA tenderness. Lungs: clear to auscultation bilaterally Heart: regular rate and rhythm, S1, S2 normal, no murmur, click, rub or gallop Abdomen: soft, non-tender; bowel sounds normal; no masses,  no organomegaly Pulses: 2+ and symmetric Ext: no  mottling,  Swelling,  Cords,  Negative homan's sign MSK: no knee or hip pain with full ROM Skin: Skin color, texture, turgor normal. No rashes or lesions Lymph nodes: Cervical, supraclavicular, and axillary nodes normal.   Assessment & Plan:   Problem List Items Addressed This Visit      Unprioritized   Leg pain, diffuse, right - Primary    Etiology unclear.  DVT ruled out,  Vit D deficiency ruled out and  Osteomyelitis unlikely given normal ESR and  CBC .       Relevant Orders   D-Dimer, Quantitative (Completed)   Sedimentation rate (Completed)   Comprehensive metabolic panel (Completed)   CBC with Differential/Platelet (Completed)   US Venous Img Lower Unilateral Right (DVT) (Completed)   History of osteomyelitis as a child    Other Visit Diagnoses    Vitamin D deficiency       Relevant Orders   VITAMIN D 25 Hydroxy (Vit-D Deficiency, Fractures) (Completed)   Need for shingles vaccine       Relevant Orders   Varicella-zoster vaccine IM (Shingrix) (Completed)      I have  discontinued Joelene Millin Voyles's acetaminophen, Estradiol-Norethindrone Acet, Calcium-Magnesium-Vitamin D (CALCIUM 1200+D3 PO), vitamin B-12, multivitamin with minerals, Probiotic Product (PROBIOTIC DAILY PO), PREBIOTIC PRODUCT PO, (Flaxseed, Linseed, (GROUND FLAX SEEDS PO)), and oxyCODONE. I am also having her maintain her Nutritional Supplements (JUICE PLUS FIBRE PO), ESTRADIOL VA, Calcium Carbonate-Vitamin D (CALTRATE 600+D PO), vitamin D3, and zinc gluconate.  No orders of the defined types were placed in this encounter.   Medications Discontinued During This Encounter  Medication Reason  . acetaminophen (TYLENOL) 500 MG tablet   . Calcium-Magnesium-Vitamin D (CALCIUM 1200+D3 PO)   . Estradiol-Norethindrone Acet 0.5-0.1 MG tablet   . Flaxseed, Linseed, (GROUND FLAX SEEDS PO)   . Multiple Vitamin (MULTIVITAMIN WITH MINERALS) TABS tablet   . oxyCODONE (ROXICODONE) 5 MG immediate release tablet   .  PREBIOTIC PRODUCT PO   . Probiotic Product (PROBIOTIC DAILY PO)   . vitamin B-12 (CYANOCOBALAMIN) 1000 MCG tablet     Follow-up: No follow-ups on file.   Crecencio Mc, MD

## 2020-11-26 NOTE — Assessment & Plan Note (Addendum)
Etiology unclear.  DVT ruled out,  Vit D deficiency ruled out and  Osteomyelitis unlikely given normal ESR and  CBC .

## 2020-11-26 NOTE — Patient Instructions (Addendum)
YOu received the shingles  vaccine today .  Expect to feel achey and flu like for 2 days  2nd /final dose is due by end of September (you can schedule an RN visit)  Labs and ultrasound today to rule out DVT

## 2020-11-27 ENCOUNTER — Encounter: Payer: Self-pay | Admitting: Internal Medicine

## 2020-11-27 LAB — VITAMIN D 25 HYDROXY (VIT D DEFICIENCY, FRACTURES): VITD: 31.96 ng/mL (ref 30.00–100.00)

## 2021-02-10 ENCOUNTER — Ambulatory Visit: Payer: No Typology Code available for payment source

## 2021-02-18 ENCOUNTER — Ambulatory Visit: Payer: No Typology Code available for payment source

## 2021-03-05 ENCOUNTER — Other Ambulatory Visit: Payer: Self-pay

## 2021-03-05 ENCOUNTER — Ambulatory Visit (INDEPENDENT_AMBULATORY_CARE_PROVIDER_SITE_OTHER): Payer: No Typology Code available for payment source

## 2021-03-05 DIAGNOSIS — Z23 Encounter for immunization: Secondary | ICD-10-CM | POA: Diagnosis not present

## 2021-03-05 NOTE — Progress Notes (Signed)
Patient presented for shingrix injection to left deltoid, patient voiced no concerns nor showed any signs of distress during injection. 

## 2021-08-27 ENCOUNTER — Ambulatory Visit (INDEPENDENT_AMBULATORY_CARE_PROVIDER_SITE_OTHER): Payer: No Typology Code available for payment source | Admitting: Dermatology

## 2021-08-27 ENCOUNTER — Other Ambulatory Visit: Payer: Self-pay

## 2021-08-27 DIAGNOSIS — L578 Other skin changes due to chronic exposure to nonionizing radiation: Secondary | ICD-10-CM | POA: Diagnosis not present

## 2021-08-27 DIAGNOSIS — L719 Rosacea, unspecified: Secondary | ICD-10-CM

## 2021-08-27 DIAGNOSIS — D1800 Hemangioma unspecified site: Secondary | ICD-10-CM

## 2021-08-27 DIAGNOSIS — L814 Other melanin hyperpigmentation: Secondary | ICD-10-CM

## 2021-08-27 DIAGNOSIS — Z1283 Encounter for screening for malignant neoplasm of skin: Secondary | ICD-10-CM | POA: Diagnosis not present

## 2021-08-27 DIAGNOSIS — L821 Other seborrheic keratosis: Secondary | ICD-10-CM

## 2021-08-27 DIAGNOSIS — D225 Melanocytic nevi of trunk: Secondary | ICD-10-CM

## 2021-08-27 DIAGNOSIS — D229 Melanocytic nevi, unspecified: Secondary | ICD-10-CM | POA: Diagnosis not present

## 2021-08-27 DIAGNOSIS — D239 Other benign neoplasm of skin, unspecified: Secondary | ICD-10-CM

## 2021-08-27 DIAGNOSIS — D485 Neoplasm of uncertain behavior of skin: Secondary | ICD-10-CM

## 2021-08-27 HISTORY — DX: Other benign neoplasm of skin, unspecified: D23.9

## 2021-08-27 NOTE — Progress Notes (Addendum)
New Patient Visit   Subjective  Hayley Jacobson is a 55 y.o. female who presents for the following: Annual Exam (No history of skin cancer or abnormal moles - TBSE today). The patient presents for Total-Body Skin Exam (TBSE) for skin cancer screening and mole check.  The patient has spots, moles and lesions to be evaluated, some may be new or changing and the patient has concerns that these could be cancer.  The following portions of the chart were reviewed this encounter and updated as appropriate:   Tobacco  Allergies  Meds  Problems  Med Hx  Surg Hx  Fam Hx     Review of Systems:  No other skin or systemic complaints except as noted in HPI or Assessment and Plan.  Objective  Well appearing patient in no apparent distress; mood and affect are within normal limits.  A full examination was performed including scalp, head, eyes, ears, nose, lips, neck, chest, axillae, abdomen, back, buttocks, bilateral upper extremities, bilateral lower extremities, hands, feet, fingers, toes, fingernails, and toenails. All findings within normal limits unless otherwise noted below.  Face Pinkness  Left Lower Back post waistline 0.7 cm irregular brown macule   Assessment & Plan   Lentigines - Scattered tan macules - Due to sun exposure - Benign-appearing, observe - Recommend daily broad spectrum sunscreen SPF 30+ to sun-exposed areas, reapply every 2 hours as needed. - Call for any changes  Seborrheic Keratoses - Stuck-on, waxy, tan-brown papules and/or plaques  - Benign-appearing - Discussed benign etiology and prognosis. - Observe - Call for any changes  Melanocytic Nevi - Tan-brown and/or pink-flesh-colored symmetric macules and papules - Benign appearing on exam today - Observation - Call clinic for new or changing moles - Recommend daily use of broad spectrum spf 30+ sunscreen to sun-exposed areas.   Hemangiomas - Red papules - Discussed benign nature - Observe -  Call for any changes  Actinic Damage - Chronic condition, secondary to cumulative UV/sun exposure - diffuse scaly erythematous macules with underlying dyspigmentation - Recommend daily broad spectrum sunscreen SPF 30+ to sun-exposed areas, reapply every 2 hours as needed.  - Staying in the shade or wearing long sleeves, sun glasses (UVA+UVB protection) and wide brim hats (4-inch brim around the entire circumference of the hat) are also recommended for sun protection.  - Call for new or changing lesions.  Skin cancer screening performed today.  Rosacea Face Rosacea is a chronic progressive skin condition usually affecting the face of adults, causing redness and/or acne bumps. It is treatable but not curable. It sometimes affects the eyes (ocular rosacea) as well. It may respond to topical and/or systemic medication and can flare with stress, sun exposure, alcohol, exercise and some foods.  Daily application of broad spectrum spf 30+ sunscreen to face is recommended to reduce flares.   Discussed the treatment option of BBL/laser.  Typically we recommend 1-3 treatment sessions about 5-8 weeks apart for best results.  The patient's condition may require "maintenance treatments" in the future.  The fee for BBL / laser treatments is $350 per treatment session for the whole face.  A fee can be quoted for other parts of the body. Insurance typically does not pay for BBL/laser treatments and therefore the fee is an out-of-pocket cost.  Neoplasm of uncertain behavior of skin Left Lower Back post waistline Epidermal / dermal shaving  Lesion diameter (cm):  0.7 Informed consent: discussed and consent obtained   Timeout: patient name, date of birth, surgical site,  and procedure verified   Procedure prep:  Patient was prepped and draped in usual sterile fashion Prep type:  Isopropyl alcohol Anesthesia: the lesion was anesthetized in a standard fashion   Anesthetic:  1% lidocaine w/ epinephrine  1-100,000 buffered w/ 8.4% NaHCO3 Instrument used: flexible razor blade   Hemostasis achieved with: pressure, aluminum chloride and electrodesiccation   Outcome: patient tolerated procedure well   Post-procedure details: sterile dressing applied and wound care instructions given   Dressing type: bandage and petrolatum    Specimen 1 - Surgical pathology Differential Diagnosis: Nevus vs dysplastic nevus Check Margins: No  Skin cancer screening  Return in about 1 year (around 08/27/2022) for TBSE.  I, Ashok Cordia, CMA, am acting as scribe for Sarina Ser, MD . Documentation: I have reviewed the above documentation for accuracy and completeness, and I agree with the above.  Sarina Ser, MD

## 2021-08-27 NOTE — Patient Instructions (Signed)

## 2021-09-01 ENCOUNTER — Telehealth: Payer: Self-pay

## 2021-09-01 NOTE — Telephone Encounter (Signed)
LM on VM please call here to discuss biopsy

## 2021-09-01 NOTE — Telephone Encounter (Signed)
-----   Message from Ralene Bathe, MD sent at 08/31/2021 11:45 AM EST ----- Diagnosis Skin , left lower back post waistline DYSPLASTIC COMPOUND NEVUS WITH SEVERE ATYPIA, CLOSE TO MARGIN, SEE DESCRIPTION  Severe dysplastic Schedule surgery

## 2021-09-02 ENCOUNTER — Telehealth: Payer: Self-pay

## 2021-09-02 NOTE — Telephone Encounter (Signed)
-----   Message from Ralene Bathe, MD sent at 08/31/2021 11:45 AM EST ----- Diagnosis Skin , left lower back post waistline DYSPLASTIC COMPOUND NEVUS WITH SEVERE ATYPIA, CLOSE TO MARGIN, SEE DESCRIPTION  Severe dysplastic Schedule surgery

## 2021-09-02 NOTE — Telephone Encounter (Signed)
Discussed biopsy results with pt, scheduled surgery for Oct 27 2021 at 3:30

## 2021-09-05 ENCOUNTER — Encounter: Payer: Self-pay | Admitting: Dermatology

## 2021-09-14 NOTE — Addendum Note (Signed)
Addended by: Ralene Bathe on: 09/14/2021 08:54 PM   Modules accepted: Level of Service

## 2021-10-27 ENCOUNTER — Encounter: Payer: Self-pay | Admitting: Dermatology

## 2021-10-27 ENCOUNTER — Other Ambulatory Visit: Payer: Self-pay

## 2021-10-27 ENCOUNTER — Ambulatory Visit (INDEPENDENT_AMBULATORY_CARE_PROVIDER_SITE_OTHER): Payer: No Typology Code available for payment source | Admitting: Dermatology

## 2021-10-27 DIAGNOSIS — D239 Other benign neoplasm of skin, unspecified: Secondary | ICD-10-CM

## 2021-10-27 DIAGNOSIS — D225 Melanocytic nevi of trunk: Secondary | ICD-10-CM | POA: Diagnosis not present

## 2021-10-27 MED ORDER — MUPIROCIN 2 % EX OINT: 1.0000 "application " | TOPICAL_OINTMENT | Freq: Every day | CUTANEOUS | 1 refills | Status: DC

## 2021-10-27 NOTE — Progress Notes (Signed)
° °  Follow-Up Visit   Subjective  Hayley Jacobson is a 56 y.o. female who presents for the following: Severe Dysplastic Nevus bx proven (L lower back post waistline, pt presents for excision today).  The following portions of the chart were reviewed this encounter and updated as appropriate:   Tobacco   Allergies   Meds   Problems   Med Hx   Surg Hx   Fam Hx      Review of Systems:  No other skin or systemic complaints except as noted in HPI or Assessment and Plan.  Objective  Well appearing patient in no apparent distress; mood and affect are within normal limits.  A focused examination was performed including back. Relevant physical exam findings are noted in the Assessment and Plan.  L lower back post waistline Pink bx site 1.2 x 0.7cm   Assessment & Plan  Dysplastic nevus L lower back post waistline  Severe, bx proven, excised today  Start Mupirocin oint qd to excision site  Skin excision - L lower back post waistline  Lesion length (cm):  1.2 Lesion width (cm):  0.7 Margin per side (cm):  0.2 Total excision diameter (cm):  1.6 Informed consent: discussed and consent obtained   Timeout: patient name, date of birth, surgical site, and procedure verified   Procedure prep:  Patient was prepped and draped in usual sterile fashion Prep type:  Isopropyl alcohol and povidone-iodine Anesthesia: the lesion was anesthetized in a standard fashion   Anesthetic:  1% lidocaine w/ epinephrine 1-100,000 buffered w/ 8.4% NaHCO3 (6cc lido w/ epi, 3cc bupivicaine, Total of 9cc) Instrument used: #15 blade   Hemostasis achieved with: pressure   Hemostasis achieved with comment:  Electrocautery Outcome: patient tolerated procedure well with no complications   Post-procedure details: sterile dressing applied and wound care instructions given   Dressing type: bandage, pressure dressing and bacitracin (Mupirocin)    Skin repair - L lower back post waistline Complexity:  Complex Final  length (cm):  4 Reason for type of repair: reduce tension to allow closure, reduce the risk of dehiscence, infection, and necrosis, reduce subcutaneous dead space and avoid a hematoma, allow closure of the large defect, preserve normal anatomy, preserve normal anatomical and functional relationships and enhance both functionality and cosmetic results   Undermining: area extensively undermined   Undermining comment:  Undermining Defect 1.6cm Subcutaneous layers (deep stitches):  Suture size:  2-0 Suture type: Vicryl (polyglactin 910)   Subcutaneous suture technique: Inverted Dermal. Fine/surface layer approximation (top stitches):  Suture size:  3-0 Suture type: nylon   Stitches: simple running   Suture removal (days):  7 Hemostasis achieved with: pressure Outcome: patient tolerated procedure well with no complications   Post-procedure details: sterile dressing applied and wound care instructions given   Dressing type: bandage, pressure dressing and bacitracin (Mupirocin)    mupirocin ointment (BACTROBAN) 2 % - L lower back post waistline Apply 1 application topically daily. Qd to excision site  Specimen 1 - Surgical pathology Differential Diagnosis: D48.5 Bx proven severe dysplastic nevus  Check Margins: yes Pink bx site 1.2 x 0.7cm [id number]   Return in about 1 week (around 11/03/2021) for suture removal.  I, Othelia Pulling, RMA, am acting as scribe for Sarina Ser, MD . Documentation: I have reviewed the above documentation for accuracy and completeness, and I agree with the above.  Sarina Ser, MD

## 2021-10-27 NOTE — Patient Instructions (Signed)

## 2021-10-28 ENCOUNTER — Telehealth: Payer: Self-pay

## 2021-10-28 ENCOUNTER — Encounter: Payer: Self-pay | Admitting: Dermatology

## 2021-10-28 NOTE — Telephone Encounter (Signed)
Pt doing well after yesterday's surgery./sh 

## 2021-11-03 ENCOUNTER — Other Ambulatory Visit: Payer: Self-pay

## 2021-11-03 ENCOUNTER — Ambulatory Visit (INDEPENDENT_AMBULATORY_CARE_PROVIDER_SITE_OTHER): Payer: No Typology Code available for payment source | Admitting: Dermatology

## 2021-11-03 DIAGNOSIS — D235 Other benign neoplasm of skin of trunk: Secondary | ICD-10-CM

## 2021-11-03 DIAGNOSIS — Z4802 Encounter for removal of sutures: Secondary | ICD-10-CM

## 2021-11-03 DIAGNOSIS — D239 Other benign neoplasm of skin, unspecified: Secondary | ICD-10-CM

## 2021-11-03 NOTE — Progress Notes (Signed)
° °  Follow-Up Visit   Subjective  Hayley Jacobson is a 56 y.o. female who presents for the following:  PERSISTENT DYSPLASTIC NEVUS, MARGINS FREE (L lower back post waistline, 1 wk f/u, pt presents for suture removal).  The following portions of the chart were reviewed this encounter and updated as appropriate:   Tobacco   Allergies   Meds   Problems   Med Hx   Surg Hx   Fam Hx      Review of Systems:  No other skin or systemic complaints except as noted in HPI or Assessment and Plan.  Objective  Well appearing patient in no apparent distress; mood and affect are within normal limits.  A focused examination was performed including back. Relevant physical exam findings are noted in the Assessment and Plan.  Left Lower Back post waistline Healing excision site   Assessment & Plan  Dysplastic nevus Left Lower Back post waistline  PERSISTENT DYSPLASTIC NEVUS, MARGINS FREE, bx proven  Encounter for Removal of Sutures - Incision site at the L lower back post waistline is clean, dry and intact - Wound cleansed, sutures removed, wound cleansed and steri strips applied.  - Discussed pathology results showing PERSISTENT DYSPLASTIC NEVUS, MARGINS FREE  - Patient advised to keep steri-strips dry until they fall off. - Scars remodel for a full year. - Once steri-strips fall off, patient can apply over-the-counter silicone scar cream each night to help with scar remodeling if desired. - Patient advised to call with any concerns or if they notice any new or changing lesions.   Intralesional injection - Left Lower Back post waistline  Related Medications mupirocin ointment (BACTROBAN) 2 % Apply 1 application topically daily. Qd to excision site   Return for as scheduled for TBSE 08/2022.  I, Othelia Pulling, RMA, am acting as scribe for Sarina Ser, MD . Documentation: I have reviewed the above documentation for accuracy and completeness, and I agree with the above.  Sarina Ser,  MD

## 2021-11-03 NOTE — Patient Instructions (Signed)

## 2021-11-06 ENCOUNTER — Encounter: Payer: Self-pay | Admitting: Dermatology

## 2022-03-12 ENCOUNTER — Telehealth: Payer: Self-pay

## 2022-03-12 NOTE — Telephone Encounter (Signed)
LMTCB. Pt is over due for an appt with Dr. Darrick Huntsman. Please schedule when pt calls back.

## 2022-04-21 ENCOUNTER — Ambulatory Visit (INDEPENDENT_AMBULATORY_CARE_PROVIDER_SITE_OTHER): Payer: No Typology Code available for payment source

## 2022-04-21 ENCOUNTER — Ambulatory Visit (INDEPENDENT_AMBULATORY_CARE_PROVIDER_SITE_OTHER): Payer: No Typology Code available for payment source | Admitting: Internal Medicine

## 2022-04-21 ENCOUNTER — Encounter: Payer: Self-pay | Admitting: Internal Medicine

## 2022-04-21 VITALS — BP 100/60 | HR 78 | Temp 98.2°F | Resp 14 | Ht 60.0 in | Wt 150.2 lb

## 2022-04-21 DIAGNOSIS — R251 Tremor, unspecified: Secondary | ICD-10-CM

## 2022-04-21 DIAGNOSIS — M25561 Pain in right knee: Secondary | ICD-10-CM

## 2022-04-21 DIAGNOSIS — Z1159 Encounter for screening for other viral diseases: Secondary | ICD-10-CM

## 2022-04-21 DIAGNOSIS — E782 Mixed hyperlipidemia: Secondary | ICD-10-CM

## 2022-04-21 DIAGNOSIS — M25512 Pain in left shoulder: Secondary | ICD-10-CM

## 2022-04-21 DIAGNOSIS — D235 Other benign neoplasm of skin of trunk: Secondary | ICD-10-CM

## 2022-04-21 DIAGNOSIS — M810 Age-related osteoporosis without current pathological fracture: Secondary | ICD-10-CM

## 2022-04-21 DIAGNOSIS — G8929 Other chronic pain: Secondary | ICD-10-CM | POA: Diagnosis not present

## 2022-04-21 DIAGNOSIS — E559 Vitamin D deficiency, unspecified: Secondary | ICD-10-CM | POA: Diagnosis not present

## 2022-04-21 DIAGNOSIS — Z114 Encounter for screening for human immunodeficiency virus [HIV]: Secondary | ICD-10-CM

## 2022-04-21 DIAGNOSIS — R5383 Other fatigue: Secondary | ICD-10-CM

## 2022-04-21 DIAGNOSIS — G2581 Restless legs syndrome: Secondary | ICD-10-CM | POA: Diagnosis not present

## 2022-04-21 NOTE — Progress Notes (Signed)
Subjective:  Patient ID: Hayley Jacobson, female    DOB: Oct 18, 1965  Age: 56 y.o. MRN: [id number]  CC: The primary encounter diagnosis was Vitamin D deficiency. Diagnoses of Need for hepatitis C screening test, Screening for HIV (human immunodeficiency virus), Chronic left shoulder pain, Dysplastic nevus syndrome with nevus of torso, Tremor of both hands, Other fatigue, Moderate mixed hyperlipidemia not requiring statin therapy, Chronic pain of right knee, Restless legs, and Osteoporosis of femur without pathological fracture were also pertinent to this visit.   HPI Hayley Jacobson presents for  Chief Complaint  Patient presents with   Follow-up    Overdue, disc bone density results from Dr.James Tomlin's ofc 08/2021.   Had a DEXA in Dec 2022 by GYN . Osteoporosis of right hip  no history of fractures.   Right knee pain   some crepitus and swelling.  Chronic.  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   Normal PAP and mammogram this year.     Using gabapentin prn sleep       Outpatient Medications Prior to Visit  Medication Sig Dispense Refill   Calcium Carbonate-Vitamin D (CALTRATE 600+D PO) Take 1 tablet by mouth daily.     Cholecalciferol (VITAMIN D3) 50 MCG (2000 UT) CAPS Take 1 capsule by mouth daily.     ESTRADIOL VA Place 1 Applicatorful vaginally 2 (two) times a week.     gabapentin (NEURONTIN) 100 MG capsule SMARTSIG:1 Capsule(s) By Mouth Every Night PRN     mupirocin ointment (BACTROBAN) 2 % Apply 1 application topically daily. Qd to excision site 22 g 1   Nutritional Supplements (JUICE PLUS FIBRE PO) Take 2 tablets by mouth 2 (two) times a day.      zinc gluconate 50 MG tablet Take 50 mg by mouth daily.     No facility-administered medications prior to visit.    Review of Systems;  Patient denies headache, fevers, malaise, unintentional weight loss, skin rash, eye pain, sinus congestion and sinus pain, sore throat, dysphagia,   hemoptysis , cough, dyspnea, wheezing, chest pain, palpitations, orthopnea, edema, abdominal pain, nausea, melena, diarrhea, constipation, flank pain, dysuria, hematuria, urinary  Frequency, nocturia, numbness, tingling, seizures,  Focal weakness, Loss of consciousness,  Tremor, insomnia, depression, anxiety, and suicidal ideation.      Objective:  BP 100/60 (BP Location: Left Arm, Patient Position: Sitting, Cuff Size: Normal)   Pulse 78   Temp 98.2 F (36.8 C) (Oral)   Resp 14   Ht 5' (1.524 m)   Wt 150 lb 3.2 oz (68.1 kg)   SpO2 96%   BMI 29.33 kg/m   BP Readings from Last 3 Encounters:  04/21/22 100/60  11/26/20 110/74  03/20/19 139/76    Wt Readings from Last 3 Encounters:  04/21/22 150 lb 3.2 oz (68.1 kg)  11/26/20 140 lb 9.6 oz (63.8 kg)  03/20/19 143 lb 1.3 oz (64.9 kg)    General appearance: alert, cooperative and appears stated age Ears: normal TM's and external ear canals both ears Throat: lips, mucosa, and tongue normal; teeth and gums normal Neck: no adenopathy, no carotid bruit, supple, symmetrical, trachea midline and thyroid not enlarged, symmetric, no tenderness/mass/nodules Back: symmetric, no curvature. ROM normal. No CVA tenderness. Lungs: clear to auscultation bilaterally Heart: regular rate and rhythm, S1, S2 normal, no murmur, click, rub or gallop Abdomen: soft, non-tender; bowel sounds normal; no masses,  no organomegaly Pulses: 2+ and symmetric Skin: Skin color, texture, turgor normal.  No rashes or lesions Lymph nodes: Cervical, supraclavicular, and axillary nodes normal.  No results found for: "HGBA1C"  Lab Results  Component Value Date   CREATININE 0.83 11/26/2020   CREATININE 0.8 05/29/2020    Lab Results  Component Value Date   WBC 4.4 11/26/2020   HGB 14.2 11/26/2020   HCT 43.3 11/26/2020   PLT 236.0 11/26/2020   GLUCOSE 89 11/26/2020   CHOL 221 (A) 05/29/2020   TRIG 52 05/29/2020   HDL 65 05/29/2020   LDLCALC 147 05/29/2020    ALT 12 11/26/2020   AST 17 11/26/2020   NA 138 11/26/2020   K 4.4 11/26/2020   CL 104 11/26/2020   CREATININE 0.83 11/26/2020   BUN 17 11/26/2020   CO2 29 11/26/2020   TSH 0.98 05/29/2020    US Venous Img Lower Unilateral Right (DVT)  Result Date: 11/26/2020 CLINICAL DATA:  Diffuse right leg pain EXAM: RIGHT LOWER EXTREMITY VENOUS DOPPLER ULTRASOUND TECHNIQUE: Gray-scale sonography with graded compression, as well as color Doppler and duplex ultrasound were performed to evaluate the lower extremity deep venous systems from the level of the common femoral vein and including the common femoral, femoral, profunda femoral, popliteal and calf veins including the posterior tibial, peroneal and gastrocnemius veins when visible. The superficial great saphenous vein was also interrogated. Spectral Doppler was utilized to evaluate flow at rest and with distal augmentation maneuvers in the common femoral, femoral and popliteal veins. COMPARISON:  None. FINDINGS: Contralateral Common Femoral Vein: Respiratory phasicity is normal and symmetric with the symptomatic side. No evidence of thrombus. Normal compressibility. Common Femoral Vein: No evidence of thrombus. Normal compressibility, respiratory phasicity and response to augmentation. Saphenofemoral Junction: No evidence of thrombus. Normal compressibility and flow on color Doppler imaging. Profunda Femoral Vein: No evidence of thrombus. Normal compressibility and flow on color Doppler imaging. Femoral Vein: No evidence of thrombus. Normal compressibility, respiratory phasicity and response to augmentation. Popliteal Vein: No evidence of thrombus. Normal compressibility, respiratory phasicity and response to augmentation. Calf Veins: No evidence of thrombus. Normal compressibility and flow on color Doppler imaging. Superficial Great Saphenous Vein: No evidence of thrombus. Normal compressibility. Venous Reflux:  None. Other Findings:  None. IMPRESSION: No evidence  of deep venous thrombosis. Electronically Signed   By: Inez Catalina M.D.   On: 11/26/2020 17:19    Assessment & Plan:   Problem List Items Addressed This Visit     Chronic left shoulder pain   Relevant Medications   gabapentin (NEURONTIN) 100 MG capsule   Dysplastic nevus syndrome with nevus of torso   Osteoporosis of femur without pathological fracture    Reviewed DEXA scan, treatment options calcium,Vitamin D needs  handouts given        Restless legs    Treated with gabapentin .  checking iron stores.       Relevant Orders   IBC + Ferritin   Right knee pain    Plain films ordered to evaluate for effusion  and joint space       Relevant Orders   DG Knee Complete 4 Views Right (Completed)   Other Visit Diagnoses     Vitamin D deficiency    -  Primary   Relevant Orders   VITAMIN D 25 Hydroxy (Vit-D Deficiency, Fractures)   Need for hepatitis C screening test       Relevant Orders   Hepatitis C Antibody   Screening for HIV (human immunodeficiency virus)       Tremor of both  hands       Relevant Orders   TSH   Other fatigue       Relevant Orders   CBC with Differential/Platelet   Comprehensive metabolic panel   Moderate mixed hyperlipidemia not requiring statin therapy       Relevant Orders   Lipid panel       I spent a total of 40 minutes with this patient in a face to face visit on the date of this encounter reviewing the last office visit with me , her   most recent with patient's orthopedist,   most recent imaging study ,  counselling on treatment of osteoporosis  and post visit ordering of testing and therapeutics.    Follow-up: No follow-ups on file.   Crecencio Mc, MD

## 2022-04-21 NOTE — Assessment & Plan Note (Signed)
Reviewed DEXA scan, treatment options calcium,Vitamin D needs  handouts given

## 2022-04-21 NOTE — Assessment & Plan Note (Signed)
Treated with gabapentin .  checking iron stores.

## 2022-04-21 NOTE — Assessment & Plan Note (Signed)
Plain films ordered to evaluate for effusion  and joint space

## 2022-04-22 ENCOUNTER — Other Ambulatory Visit: Payer: Self-pay | Admitting: Internal Medicine

## 2022-04-22 LAB — LIPID PANEL
Cholesterol: 210 mg/dL — ABNORMAL HIGH (ref 0–200)
HDL: 59.2 mg/dL (ref >39.00)
LDL Cholesterol: 136 mg/dL — ABNORMAL HIGH (ref 0–99)
NonHDL: 150.5
Total CHOL/HDL Ratio: 4
Triglycerides: 71 mg/dL (ref 0.0–149.0)
VLDL: 14.2 mg/dL (ref 0.0–40.0)

## 2022-04-22 LAB — COMPREHENSIVE METABOLIC PANEL
ALT: 15 U/L (ref 0–35)
AST: 19 U/L (ref 0–37)
Albumin: 4.6 g/dL (ref 3.5–5.2)
Alkaline Phosphatase: 54 U/L (ref 39–117)
BUN: 11 mg/dL (ref 6–23)
CO2: 27 mEq/L (ref 19–32)
Calcium: 9.3 mg/dL (ref 8.4–10.5)
Chloride: 107 mEq/L (ref 96–112)
Creatinine, Ser: 0.81 mg/dL (ref 0.40–1.20)
GFR: 80.33 mL/min (ref >60.00)
Glucose, Bld: 87 mg/dL (ref 70–99)
Potassium: 4 mEq/L (ref 3.5–5.1)
Sodium: 141 mEq/L (ref 135–145)
Total Bilirubin: 0.7 mg/dL (ref 0.2–1.2)
Total Protein: 6.7 g/dL (ref 6.0–8.3)

## 2022-04-22 LAB — CBC WITH DIFFERENTIAL/PLATELET
Basophils Absolute: 0 10*3/uL (ref 0.0–0.1)
Basophils Relative: 0.8 % (ref 0.0–3.0)
Eosinophils Absolute: 0.1 10*3/uL (ref 0.0–0.7)
Eosinophils Relative: 2.1 % (ref 0.0–5.0)
HCT: 40.2 % (ref 36.0–46.0)
Hemoglobin: 13.2 g/dL (ref 12.0–15.0)
Lymphocytes Relative: 34.2 % (ref 12.0–46.0)
Lymphs Abs: 2.1 10*3/uL (ref 0.7–4.0)
MCHC: 32.8 g/dL (ref 30.0–36.0)
MCV: 85.8 fl (ref 78.0–100.0)
Monocytes Absolute: 0.4 10*3/uL (ref 0.1–1.0)
Monocytes Relative: 6 % (ref 3.0–12.0)
Neutro Abs: 3.5 10*3/uL (ref 1.4–7.7)
Neutrophils Relative %: 56.9 % (ref 43.0–77.0)
Platelets: 225 10*3/uL (ref 150.0–400.0)
RBC: 4.69 Mil/uL (ref 3.87–5.11)
RDW: 13.1 % (ref 11.5–15.5)
WBC: 6.2 10*3/uL (ref 4.0–10.5)

## 2022-04-22 LAB — IBC + FERRITIN
Ferritin: 90 ng/mL (ref 10.0–291.0)
Iron: 73 ug/dL (ref 42–145)
Saturation Ratios: 17.4 % — ABNORMAL LOW (ref 20.0–50.0)
TIBC: 418.6 ug/dL (ref 250.0–450.0)
Transferrin: 299 mg/dL (ref 212.0–360.0)

## 2022-04-22 LAB — TSH: TSH: 0.77 u[IU]/mL (ref 0.35–5.50)

## 2022-04-22 LAB — VITAMIN D 25 HYDROXY (VIT D DEFICIENCY, FRACTURES): VITD: 18.37 ng/mL — ABNORMAL LOW (ref 30.00–100.00)

## 2022-04-22 LAB — HEPATITIS C ANTIBODY: Hepatitis C Ab: NONREACTIVE

## 2022-04-22 MED ORDER — ERGOCALCIFEROL 1.25 MG (50000 UT) PO CAPS: 50000.0000 [IU] | ORAL_CAPSULE | ORAL | 0 refills | Status: DC

## 2022-05-15 ENCOUNTER — Other Ambulatory Visit: Payer: Self-pay | Admitting: Internal Medicine

## 2022-07-06 IMAGING — US US EXTREM LOW VENOUS*R*
1 series · 13 of 24 positions shown · non-contrast
Comparison: None.

CLINICAL DATA: Diffuse right leg pain



[Series 1: us extrem low venous*right* · 0.07mm/px · 13 of 34 slices shown]
[im 1/34]
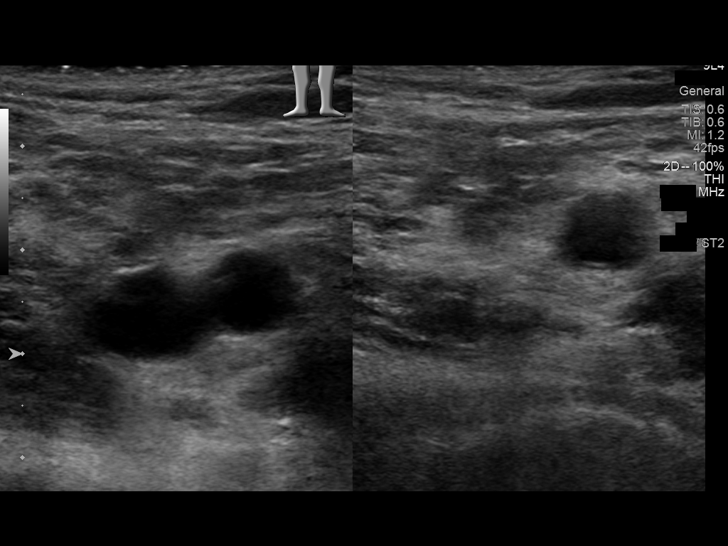
[im 3/34]
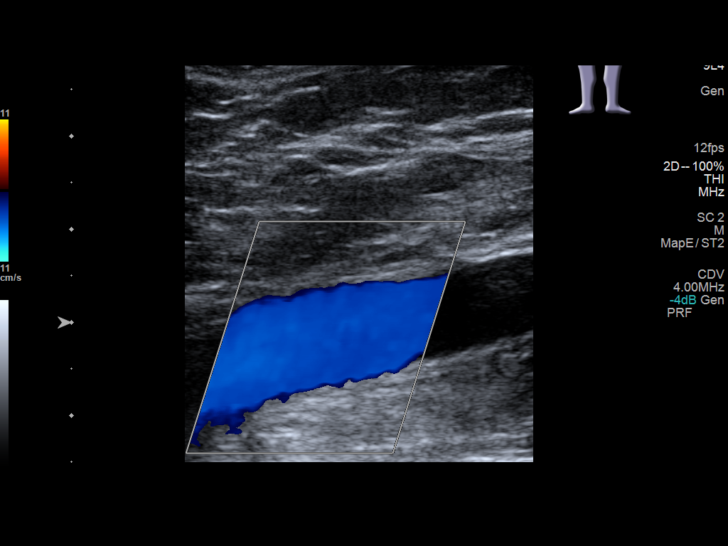
[im 6/34]
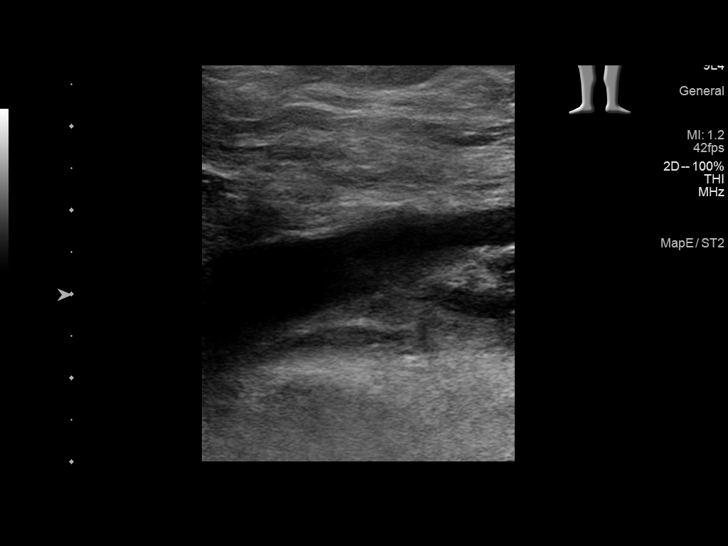
[im 9/34]
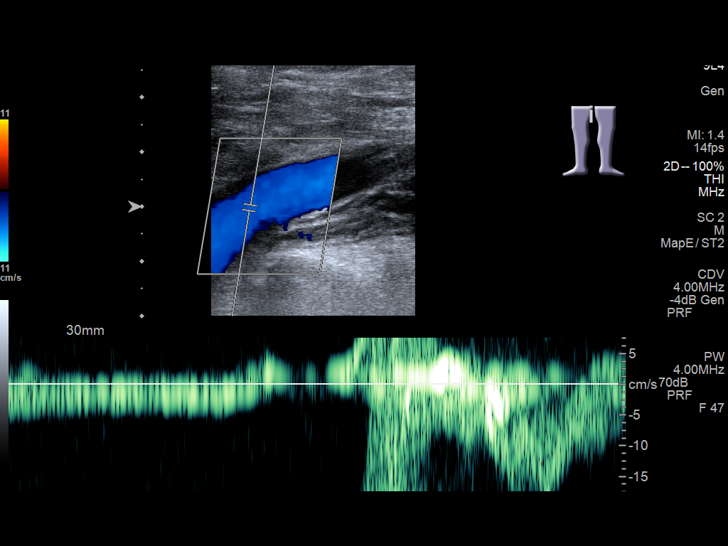
[im 12/34]
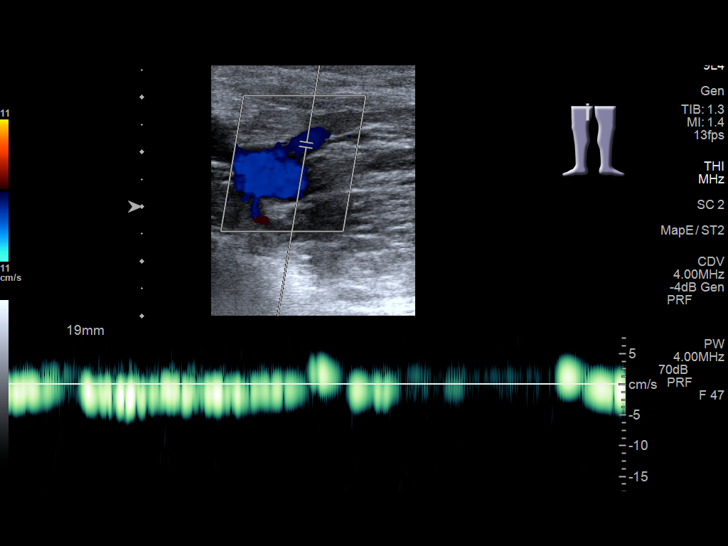
[im 15/34]
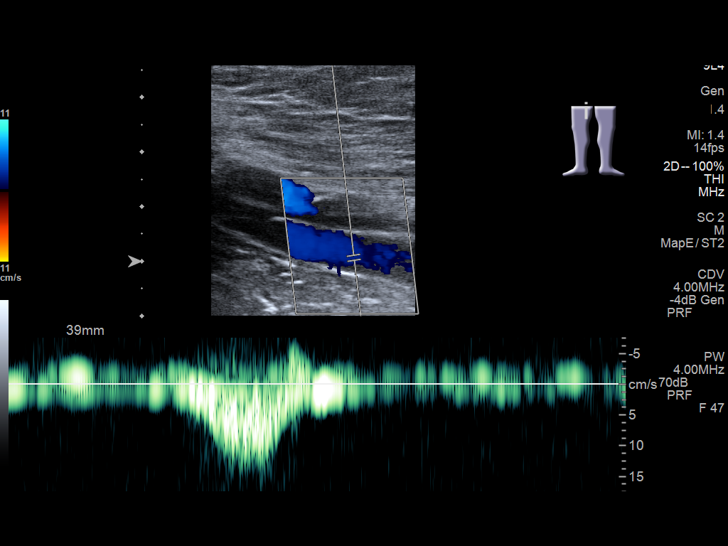
[im 18/34]
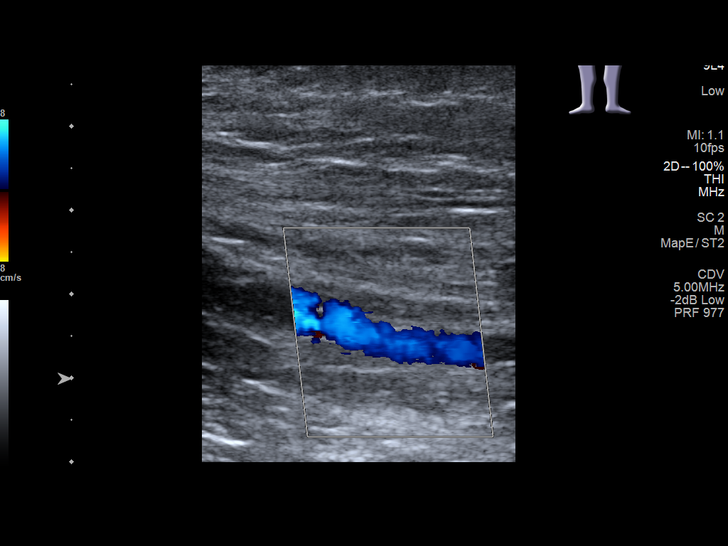
[im 19/34]
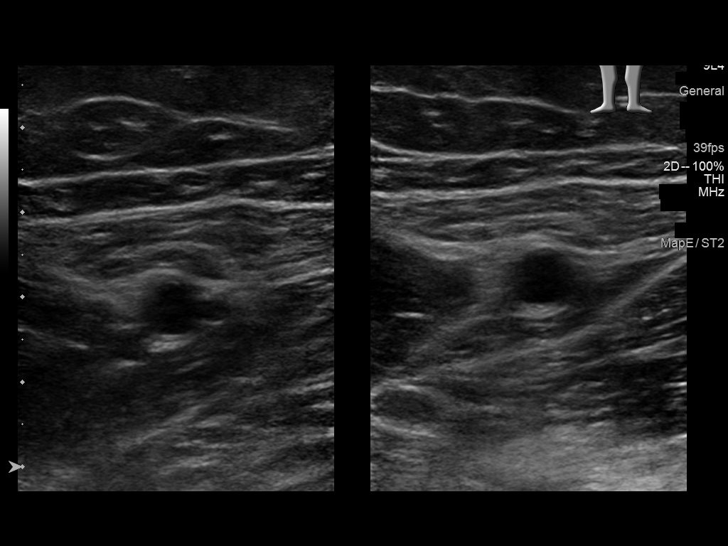
[im 22/34]
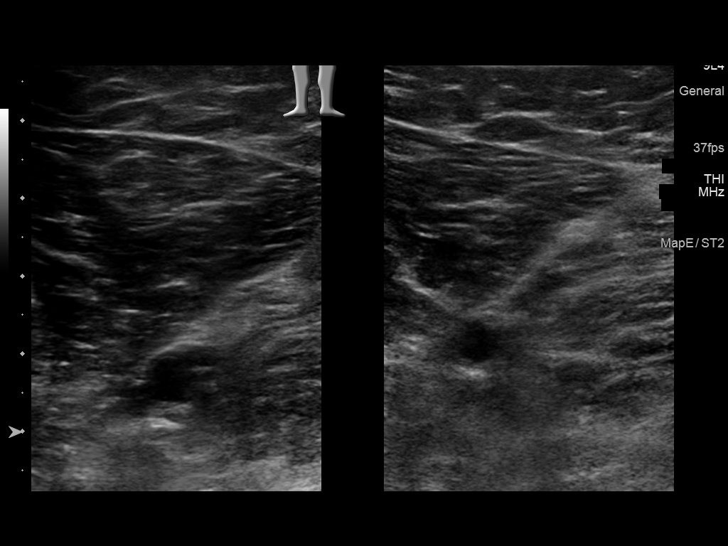
[im 25/34]
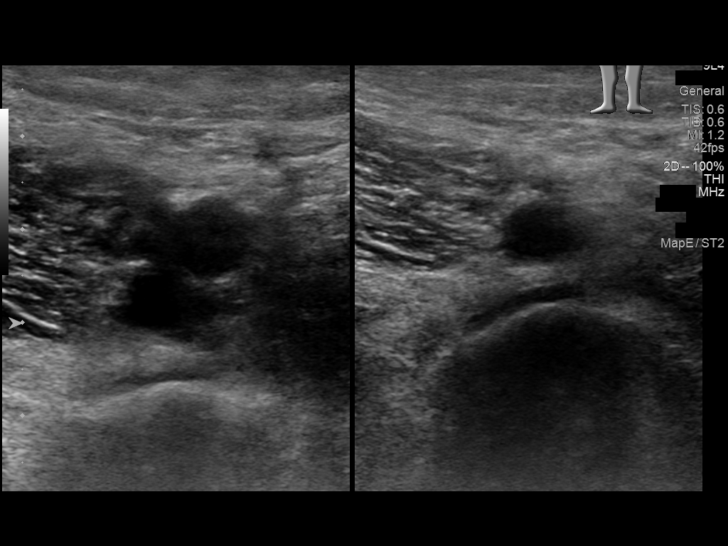
[im 28/34]
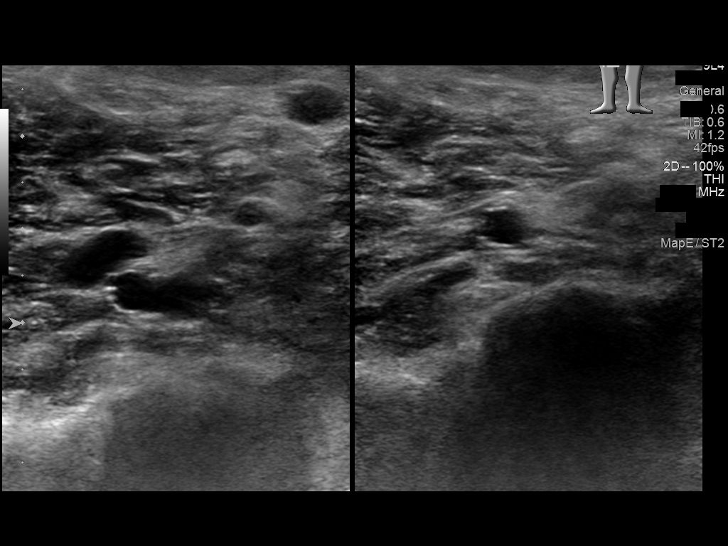
[im 31/34]
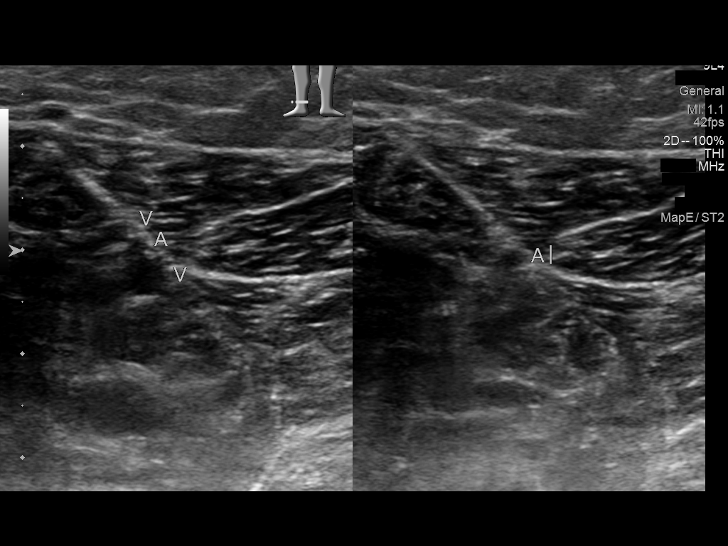
[im 34/34]
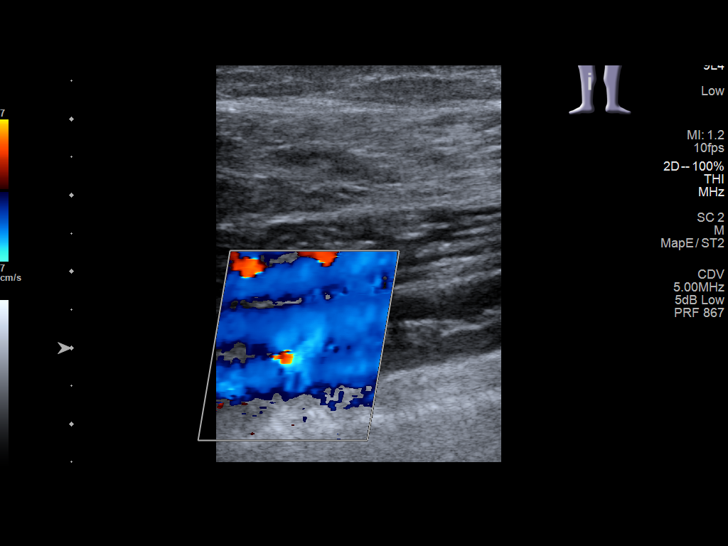

[13 of 24 positions shown; findings below may reference images not displayed]

FINDINGS: Contralateral Common Femoral Vein: Respiratory phasicity is normal
and symmetric with the symptomatic side. No evidence of thrombus.
Normal compressibility.

Common Femoral Vein: No evidence of thrombus. Normal
compressibility, respiratory phasicity and response to augmentation.

Saphenofemoral Junction: No evidence of thrombus. Normal
compressibility and flow on color Doppler imaging.

Profunda Femoral Vein: No evidence of thrombus. Normal
compressibility and flow on color Doppler imaging.

Femoral Vein: No evidence of thrombus. Normal compressibility,
respiratory phasicity and response to augmentation.

Popliteal Vein: No evidence of thrombus. Normal compressibility,
respiratory phasicity and response to augmentation.

Calf Veins: No evidence of thrombus. Normal compressibility and flow
on color Doppler imaging.

Superficial Great Saphenous Vein: No evidence of thrombus. Normal
compressibility.

Venous Reflux:  None.

Other Findings:  None.
IMPRESSION: No evidence of deep venous thrombosis.

## 2022-08-30 ENCOUNTER — Ambulatory Visit (INDEPENDENT_AMBULATORY_CARE_PROVIDER_SITE_OTHER): Payer: No Typology Code available for payment source | Admitting: Dermatology

## 2022-08-30 DIAGNOSIS — Z1283 Encounter for screening for malignant neoplasm of skin: Secondary | ICD-10-CM | POA: Diagnosis not present

## 2022-08-30 DIAGNOSIS — Z86018 Personal history of other benign neoplasm: Secondary | ICD-10-CM | POA: Diagnosis not present

## 2022-08-30 DIAGNOSIS — L578 Other skin changes due to chronic exposure to nonionizing radiation: Secondary | ICD-10-CM | POA: Diagnosis not present

## 2022-08-30 DIAGNOSIS — L814 Other melanin hyperpigmentation: Secondary | ICD-10-CM

## 2022-08-30 DIAGNOSIS — D229 Melanocytic nevi, unspecified: Secondary | ICD-10-CM

## 2022-08-30 DIAGNOSIS — L719 Rosacea, unspecified: Secondary | ICD-10-CM | POA: Diagnosis not present

## 2022-08-30 DIAGNOSIS — L820 Inflamed seborrheic keratosis: Secondary | ICD-10-CM | POA: Diagnosis not present

## 2022-08-30 NOTE — Patient Instructions (Addendum)
Cryotherapy Aftercare  Wash gently with soap and water everyday.   Apply Vaseline and Band-Aid daily until healed.     Due to recent changes in healthcare laws, you may see results of your pathology and/or laboratory studies on MyChart before the doctors have had a chance to review them. We understand that in some cases there may be results that are confusing or concerning to you. Please understand that not all results are received at the same time and often the doctors may need to interpret multiple results in order to provide you with the best plan of care or course of treatment. Therefore, we ask that you please give us 2 business days to thoroughly review all your results before contacting the office for clarification. Should we see a critical lab result, you will be contacted sooner.   If You Need Anything After Your Visit  If you have any questions or concerns for your doctor, please call our main line at [phone] and press option 4 to reach your doctor's medical assistant. If no one answers, please leave a voicemail as directed and we will return your call as soon as possible. Messages left after 4 pm will be answered the following business day.   You may also send us a message via MyChart. We typically respond to MyChart messages within 1-2 business days.  For prescription refills, please ask your pharmacy to contact our office. Our fax number is [phone].  If you have an urgent issue when the clinic is closed that cannot wait until the next business day, you can page your doctor at the number below.    Please note that while we do our best to be available for urgent issues outside of office hours, we are not available 24/7.   If you have an urgent issue and are unable to reach us, you may choose to seek medical care at your doctor's office, retail clinic, urgent care center, or emergency room.  If you have a medical emergency, please immediately call 911 or go to the  emergency department.  Pager Numbers  - Dr. Kowalski: [phone]  - Dr. Moye: [phone]  - Dr. Stewart: [phone]  In the event of inclement weather, please call our main line at [phone] for an update on the status of any delays or closures.  Dermatology Medication Tips: Please keep the boxes that topical medications come in in order to help keep track of the instructions about where and how to use these. Pharmacies typically print the medication instructions only on the boxes and not directly on the medication tubes.   If your medication is too expensive, please contact our office at [phone] option 4 or send us a message through MyChart.   We are unable to tell what your co-pay for medications will be in advance as this is different depending on your insurance coverage. However, we may be able to find a substitute medication at lower cost or fill out paperwork to get insurance to cover a needed medication.   If a prior authorization is required to get your medication covered by your insurance company, please allow us 1-2 business days to complete this process.  Drug prices often vary depending on where the prescription is filled and some pharmacies may offer cheaper prices.  The website www.goodrx.com contains coupons for medications through different pharmacies. The prices here do not account for what the cost may be with help from insurance (it may be cheaper with your insurance), but the website can   give you the price if you did not use any insurance.  - You can print the associated coupon and take it with your prescription to the pharmacy.  - You may also stop by our office during regular business hours and pick up a GoodRx coupon card.  - If you need your prescription sent electronically to a different pharmacy, notify our office through [facility] MyChart or by phone at [phone] option 4.     Si Usted Necesita Algo Despus de Su Visita  Tambin puede  enviarnos un mensaje a travs de MyChart. Por lo general respondemos a los mensajes de MyChart en el transcurso de 1 a 2 das hbiles.  Para renovar recetas, por favor pida a su farmacia que se ponga en contacto con nuestra oficina. Nuestro nmero de fax es el [phone].  Si tiene un asunto urgente cuando la clnica est cerrada y que no puede esperar hasta el siguiente da hbil, puede llamar/localizar a su doctor(a) al nmero que aparece a continuacin.   Por favor, tenga en cuenta que aunque hacemos todo lo posible para estar disponibles para asuntos urgentes fuera del horario de oficina, no estamos disponibles las 24 horas del da, los 7 das de la semana.   Si tiene un problema urgente y no puede comunicarse con nosotros, puede optar por buscar atencin mdica  en el consultorio de su doctor(a), en una clnica privada, en un centro de atencin urgente o en una sala de emergencias.  Si tiene una emergencia mdica, por favor llame inmediatamente al 911 o vaya a la sala de emergencias.  Nmeros de bper  - Dr. Kowalski: [phone]  - Dra. Moye: [phone]  - Dra. Stewart: [phone]  En caso de inclemencias del tiempo, por favor llame a nuestra lnea principal al [phone] para una actualizacin sobre el estado de cualquier retraso o cierre.  Consejos para la medicacin en dermatologa: Por favor, guarde las cajas en las que vienen los medicamentos de uso tpico para ayudarle a seguir las instrucciones sobre dnde y cmo usarlos. Las farmacias generalmente imprimen las instrucciones del medicamento slo en las cajas y no directamente en los tubos del medicamento.   Si su medicamento es muy caro, por favor, pngase en contacto con nuestra oficina llamando al [phone] y presione la opcin 4 o envenos un mensaje a travs de MyChart.   No podemos decirle cul ser su copago por los medicamentos por adelantado ya que esto es diferente dependiendo de la cobertura de su seguro.  Sin embargo, es posible que podamos encontrar un medicamento sustituto a menor costo o llenar un formulario para que el seguro cubra el medicamento que se considera necesario.   Si se requiere una autorizacin previa para que su compaa de seguros cubra su medicamento, por favor permtanos de 1 a 2 das hbiles para completar este proceso.  Los precios de los medicamentos varan con frecuencia dependiendo del lugar de dnde se surte la receta y alguna farmacias pueden ofrecer precios ms baratos.  El sitio web www.goodrx.com tiene cupones para medicamentos de diferentes farmacias. Los precios aqu no tienen en cuenta lo que podra costar con la ayuda del seguro (puede ser ms barato con su seguro), pero el sitio web puede darle el precio si no utiliz ningn seguro.  - Puede imprimir el cupn correspondiente y llevarlo con su receta a la farmacia.  - Tambin puede pasar por nuestra oficina durante el horario de atencin regular y recoger una tarjeta de cupones de GoodRx.  -   Si necesita que su receta se enve electrnicamente a una farmacia diferente, informe a nuestra oficina a travs de MyChart de [facility] o por telfono llamando al [phone] y presione la opcin 4.  

## 2022-08-30 NOTE — Progress Notes (Signed)
Follow-Up Visit   Subjective  Hayley Jacobson is a 56 y.o. female who presents for the following: Annual Exam (History of dysplastic nevi - The patient presents for Total-Body Skin Exam (TBSE) for skin cancer screening and mole check.  The patient has spots, moles and lesions to be evaluated, some may be new or changing and the patient has concerns that these could be cancer./).  The following portions of the chart were reviewed this encounter and updated as appropriate:   Tobacco  Allergies  Meds  Problems  Med Hx  Surg Hx  Fam Hx     Review of Systems:  No other skin or systemic complaints except as noted in HPI or Assessment and Plan.  Objective  Well appearing patient in no apparent distress; mood and affect are within normal limits.  A full examination was performed including scalp, head, eyes, ears, nose, lips, neck, chest, axillae, abdomen, back, buttocks, bilateral upper extremities, bilateral lower extremities, hands, feet, fingers, toes, fingernails, and toenails. All findings within normal limits unless otherwise noted below.  Face Mild pinkness  Left chest Erythematous stuck-on, waxy papule or plaque   Assessment & Plan   History of Dysplastic Nevi - No evidence of recurrence today - Recommend regular full body skin exams - Recommend daily broad spectrum sunscreen SPF 30+ to sun-exposed areas, reapply every 2 hours as needed.  - Call if any new or changing lesions are noted between office visits  Lentigines - Scattered tan macules - Due to sun exposure - Benign-appearing, observe - Recommend daily broad spectrum sunscreen SPF 30+ to sun-exposed areas, reapply every 2 hours as needed. - Call for any changes  Seborrheic Keratoses - Stuck-on, waxy, tan-brown papules and/or plaques  - Benign-appearing - Discussed benign etiology and prognosis. - Observe - Call for any changes  Melanocytic Nevi - Tan-brown and/or pink-flesh-colored symmetric macules  and papules - Benign appearing on exam today - Observation - Call clinic for new or changing moles - Recommend daily use of broad spectrum spf 30+ sunscreen to sun-exposed areas.   Hemangiomas - Red papules - Discussed benign nature - Observe - Call for any changes  Actinic Damage - Chronic condition, secondary to cumulative UV/sun exposure - diffuse scaly erythematous macules with underlying dyspigmentation - Recommend daily broad spectrum sunscreen SPF 30+ to sun-exposed areas, reapply every 2 hours as needed.  - Staying in the shade or wearing long sleeves, sun glasses (UVA+UVB protection) and wide brim hats (4-inch brim around the entire circumference of the hat) are also recommended for sun protection.  - Call for new or changing lesions.  Skin cancer screening performed today.  Rosacea Face  Rosacea is a chronic progressive skin condition usually affecting the face of adults, causing redness and/or acne bumps. It is treatable but not curable. It sometimes affects the eyes (ocular rosacea) as well. It may respond to topical and/or systemic medication and can flare with stress, sun exposure, alcohol, exercise, topical steroids (including hydrocortisone/cortisone 10) and some foods.  Daily application of broad spectrum spf 30+ sunscreen to face is recommended to reduce flares.  Mild - no treatment at this time.  Inflamed seborrheic keratosis Left chest  Destruction of lesion - Left chest Complexity: simple   Destruction method: cryotherapy   Informed consent: discussed and consent obtained   Timeout:  patient name, date of birth, surgical site, and procedure verified Lesion destroyed using liquid nitrogen: Yes   Region frozen until ice ball extended beyond lesion: Yes  Outcome: patient tolerated procedure well with no complications   Post-procedure details: wound care instructions given     Return in about 1 year (around 08/31/2023) for TBSE.  I, Ashok Cordia, CMA, am  acting as scribe for Sarina Ser, MD . Documentation: I have reviewed the above documentation for accuracy and completeness, and I agree with the above.  Sarina Ser, MD

## 2022-09-07 ENCOUNTER — Encounter: Payer: Self-pay | Admitting: Dermatology

## 2023-09-01 ENCOUNTER — Ambulatory Visit: Payer: No Typology Code available for payment source | Admitting: Dermatology

## 2023-09-01 DIAGNOSIS — L814 Other melanin hyperpigmentation: Secondary | ICD-10-CM | POA: Diagnosis not present

## 2023-09-01 DIAGNOSIS — W908XXA Exposure to other nonionizing radiation, initial encounter: Secondary | ICD-10-CM | POA: Diagnosis not present

## 2023-09-01 DIAGNOSIS — Z7189 Other specified counseling: Secondary | ICD-10-CM

## 2023-09-01 DIAGNOSIS — Z1283 Encounter for screening for malignant neoplasm of skin: Secondary | ICD-10-CM

## 2023-09-01 DIAGNOSIS — L918 Other hypertrophic disorders of the skin: Secondary | ICD-10-CM

## 2023-09-01 DIAGNOSIS — Z86018 Personal history of other benign neoplasm: Secondary | ICD-10-CM

## 2023-09-01 DIAGNOSIS — D1801 Hemangioma of skin and subcutaneous tissue: Secondary | ICD-10-CM

## 2023-09-01 DIAGNOSIS — L719 Rosacea, unspecified: Secondary | ICD-10-CM

## 2023-09-01 DIAGNOSIS — L578 Other skin changes due to chronic exposure to nonionizing radiation: Secondary | ICD-10-CM

## 2023-09-01 DIAGNOSIS — D229 Melanocytic nevi, unspecified: Secondary | ICD-10-CM

## 2023-09-01 DIAGNOSIS — L821 Other seborrheic keratosis: Secondary | ICD-10-CM

## 2023-09-01 NOTE — Progress Notes (Signed)
Follow-Up Visit   Subjective  Hayley Jacobson is a 57 y.o. female who presents for the following: Skin Cancer Screening and Full Body Skin Exam  Hx of dysplastic nevus Hx of rosacea   Noticed a skin tag under left arm    The patient presents for Total-Body Skin Exam (TBSE) for skin cancer screening and mole check. The patient has spots, moles and lesions to be evaluated, some may be new or changing and the patient may have concern these could be cancer.    The following portions of the chart were reviewed this encounter and updated as appropriate: medications, allergies, medical history  Review of Systems:  No other skin or systemic complaints except as noted in HPI or Assessment and Plan.  Objective  Well appearing patient in no apparent distress; mood and affect are within normal limits.  A full examination was performed including scalp, head, eyes, ears, nose, lips, neck, chest, axillae, abdomen, back, buttocks, bilateral upper extremities, bilateral lower extremities, hands, feet, fingers, toes, fingernails, and toenails. All findings within normal limits unless otherwise noted below.   Relevant physical exam findings are noted in the Assessment and Plan.    Assessment & Plan   SKIN CANCER SCREENING PERFORMED TODAY.  ACTINIC DAMAGE - Chronic condition, secondary to cumulative UV/sun exposure - diffuse scaly erythematous macules with underlying dyspigmentation - Recommend daily broad spectrum sunscreen SPF 30+ to sun-exposed areas, reapply every 2 hours as needed.  - Staying in the shade or wearing long sleeves, sun glasses (UVA+UVB protection) and wide brim hats (4-inch brim around the entire circumference of the hat) are also recommended for sun protection.  - Call for new or changing lesions.  LENTIGINES, SEBORRHEIC KERATOSES, HEMANGIOMAS - Benign normal skin lesions - Benign-appearing - Call for any changes  Acrochordons (Skin Tags) Under left arm  - Fleshy,  skin-colored pedunculated papules - Benign appearing.  - Observe. - If desired, they can be removed with an in office procedure that is not covered by insurance. - Please call the clinic if you notice any new or changing lesions.     MELANOCYTIC NEVI - Tan-brown and/or pink-flesh-colored symmetric macules and papules - Benign appearing on exam today - Observation - Call clinic for new or changing moles - Recommend daily use of broad spectrum spf 30+ sunscreen to sun-exposed areas.   HISTORY OF DYSPLASTIC NEVUS 08/2021 Left lower back post waistline severe excised 10/27/21 No evidence of recurrence today Recommend regular full body skin exams Recommend daily broad spectrum sunscreen SPF 30+ to sun-exposed areas, reapply every 2 hours as needed.  Call if any new or changing lesions are noted between office visits  Rosacea Face  Exam : pinkness at cheeks and nose   Rosacea is a chronic progressive skin condition usually affecting the face of adults, causing redness and/or acne bumps. It is treatable but not curable. It sometimes affects the eyes (ocular rosacea) as well. It may respond to topical and/or systemic medication and can flare with stress, sun exposure, alcohol, exercise, topical steroids (including hydrocortisone/cortisone 10) and some foods.  Daily application of broad spectrum spf 30+ sunscreen to face is recommended to reduce flares.   Mild - no treatment at this time. Patient defers treatment   Discussed topical rosacea treatments   Counseling for BBL / IPL / Laser and Coordination of Care Discussed the treatment option of Broad Band Light (BBL) /Intense Pulsed Light (IPL)/ Laser for skin discoloration, including brown spots and redness.  Typically we recommend  at least 1-3 treatment sessions about 5-8 weeks apart for best results.  Cannot have tanned skin when BBL performed, and regular use of sunscreen/photoprotection is advised after the procedure to help maintain  results. The patient's condition may also require "maintenance treatments" in the future.  The fee for BBL / laser treatments is $350 per treatment session for the whole face.  A fee can be quoted for other parts of the body.  Insurance typically does not pay for BBL/laser treatments and therefore the fee is an out-of-pocket cost. Recommend prophylactic valtrex treatment. Once scheduled for procedure, will send Rx in prior to patient's appointment.      Return in about 1 year (around 08/31/2024) for TBSE.  IAsher Muir, CMA, am acting as scribe for Armida Sans, MD.   Documentation: I have reviewed the above documentation for accuracy and completeness, and I agree with the above.  Armida Sans, MD

## 2023-09-01 NOTE — Patient Instructions (Addendum)

## 2023-09-06 ENCOUNTER — Encounter: Payer: Self-pay | Admitting: Dermatology

## 2023-10-31 ENCOUNTER — Other Ambulatory Visit: Payer: Self-pay

## 2023-10-31 MED ORDER — SEMAGLUTIDE-WEIGHT MANAGEMENT 0.25 MG/0.5ML ~~LOC~~ SOAJ
0.2500 mg | SUBCUTANEOUS | 0 refills | Status: DC
  Filled 2023-10-31: qty 2, 28d supply, fill #0

## 2023-11-02 ENCOUNTER — Other Ambulatory Visit: Payer: Self-pay

## 2023-11-03 ENCOUNTER — Other Ambulatory Visit: Payer: Self-pay

## 2023-11-08 ENCOUNTER — Other Ambulatory Visit: Payer: Self-pay

## 2024-07-18 ENCOUNTER — Ambulatory Visit

## 2024-07-18 DIAGNOSIS — Z23 Encounter for immunization: Secondary | ICD-10-CM

## 2024-07-18 NOTE — Progress Notes (Signed)
 Patient was administered a regular flu vaccine into her right deltoid. Patient tolerated the regular flu vaccine well. Per Dr. Marylynn patient was also administered a TDAP vaccine into her left deltoid. Patient tolerated the TDAP vaccine well.

## 2024-09-04 ENCOUNTER — Encounter: Payer: Self-pay | Admitting: Dermatology

## 2024-09-04 ENCOUNTER — Ambulatory Visit: Admitting: Dermatology

## 2024-09-04 DIAGNOSIS — D229 Melanocytic nevi, unspecified: Secondary | ICD-10-CM

## 2024-09-04 DIAGNOSIS — Z79899 Other long term (current) drug therapy: Secondary | ICD-10-CM

## 2024-09-04 DIAGNOSIS — Z86018 Personal history of other benign neoplasm: Secondary | ICD-10-CM

## 2024-09-04 DIAGNOSIS — L570 Actinic keratosis: Secondary | ICD-10-CM

## 2024-09-04 DIAGNOSIS — L814 Other melanin hyperpigmentation: Secondary | ICD-10-CM

## 2024-09-04 DIAGNOSIS — L821 Other seborrheic keratosis: Secondary | ICD-10-CM

## 2024-09-04 DIAGNOSIS — Z1283 Encounter for screening for malignant neoplasm of skin: Secondary | ICD-10-CM

## 2024-09-04 DIAGNOSIS — L700 Acne vulgaris: Secondary | ICD-10-CM

## 2024-09-04 DIAGNOSIS — L820 Inflamed seborrheic keratosis: Secondary | ICD-10-CM

## 2024-09-04 DIAGNOSIS — L578 Other skin changes due to chronic exposure to nonionizing radiation: Secondary | ICD-10-CM

## 2024-09-04 DIAGNOSIS — Z7189 Other specified counseling: Secondary | ICD-10-CM

## 2024-09-04 MED ORDER — CLINDAMYCIN PHOS-BENZOYL PEROX 1-5 % EX GEL: CUTANEOUS | 11 refills | Status: AC

## 2024-09-04 NOTE — Patient Instructions (Addendum)

## 2024-09-04 NOTE — Progress Notes (Signed)
 Follow-Up Visit   Subjective  Hayley Jacobson is a 58 y.o. female who presents for the following: Skin Cancer Screening and Full Body Skin Exam, hx of Dysplastic nevus. Patient reports no areas of concern. Patient would like a refill of acne topical she uses for acne breaks on her face prn  The patient presents for Total-Body Skin Exam (TBSE) for skin cancer screening and mole check. The patient has spots, moles and lesions to be evaluated, some may be new or changing and the patient may have concern these could be cancer.  The following portions of the chart were reviewed this encounter and updated as appropriate: medications, allergies, medical history  Review of Systems:  No other skin or systemic complaints except as noted in HPI or Assessment and Plan.  Objective  Well appearing patient in no apparent distress; mood and affect are within normal limits.  A full examination was performed including scalp, head, eyes, ears, nose, lips, neck, chest, axillae, abdomen, back, buttocks, bilateral upper extremities, bilateral lower extremities, hands, feet, fingers, toes, fingernails, and toenails. All findings within normal limits unless otherwise noted below.   Relevant physical exam findings are noted in the Assessment and Plan.  middle dorsum nose x1 Erythematous thin papules/macules with gritty scale.  above the left mid brow Stuck-on, waxy, tan-brown papules and plaques -- Discussed benign etiology and prognosis.   Assessment & Plan   SKIN CANCER SCREENING PERFORMED TODAY.  LENTIGINES, SEBORRHEIC KERATOSES, HEMANGIOMAS - Benign normal skin lesions - Benign-appearing - Call for any changes  MELANOCYTIC NEVI - Tan-brown and/or pink-flesh-colored symmetric macules and papules - Benign appearing on exam today - Observation - Call clinic for new or changing moles - Recommend daily use of broad spectrum spf 30+ sunscreen to sun-exposed areas.    HISTORY OF DYSPLASTIC  NEVUS 08/2021 Left lower back post waistline severe excised 10/27/21 No evidence of recurrence today Recommend regular full body skin exams Recommend daily broad spectrum sunscreen SPF 30+ to sun-exposed areas, reapply every 2 hours as needed.  Call if any new or changing lesions are noted between office visits  ACNE VULGARIS Exam: mainly clear of acne  Chronic condition with duration or expected duration over one year. Currently well-controlled.  Treatment Plan: Continue Benzaclin gel spot treat to acne bumps prn AK (ACTINIC KERATOSIS) middle dorsum nose x1 ACTINIC DAMAGE - chronic, secondary to cumulative UV radiation exposure/sun exposure over time - diffuse scaly erythematous macules with underlying dyspigmentation - Recommend daily broad spectrum sunscreen SPF 30+ to sun-exposed areas, reapply every 2 hours as needed.  - Recommend staying in the shade or wearing long sleeves, sun glasses (UVA+UVB protection) and wide brim hats (4-inch brim around the entire circumference of the hat). - Call for new or changing lesions.  - Destruction of lesion - middle dorsum nose x1 Complexity: simple   Destruction method: cryotherapy   Informed consent: discussed and consent obtained   Timeout:  patient name, date of birth, surgical site, and procedure verified Lesion destroyed using liquid nitrogen: Yes   Region frozen until ice ball extended beyond lesion: Yes   Outcome: patient tolerated procedure well with no complications   Post-procedure details: wound care instructions given    INFLAMED SEBORRHEIC KERATOSIS above the left mid brow Symptomatic, irritating, patient would like treated.  - Destruction of lesion - above the left mid brow Complexity: simple   Destruction method: cryotherapy   Informed consent: discussed and consent obtained   Timeout:  patient name, date of birth,  surgical site, and procedure verified Lesion destroyed using liquid nitrogen: Yes   Region frozen until ice  ball extended beyond lesion: Yes   Outcome: patient tolerated procedure well with no complications   Post-procedure details: wound care instructions given      Return in about 1 year (around 09/04/2025) for TBSE, hx of Dysplastic nevus .  IFay Kirks, CMA, am acting as scribe for Alm Rhyme, MD .   Documentation: I have reviewed the above documentation for accuracy and completeness, and I agree with the above.  Alm Rhyme, MD

## 2024-09-06 ENCOUNTER — Ambulatory Visit: Payer: No Typology Code available for payment source | Admitting: Dermatology

## 2024-10-02 ENCOUNTER — Observation Stay: Admitting: Anesthesiology

## 2024-10-02 ENCOUNTER — Encounter: Payer: Self-pay | Admitting: Emergency Medicine

## 2024-10-02 ENCOUNTER — Other Ambulatory Visit: Payer: Self-pay

## 2024-10-02 ENCOUNTER — Emergency Department

## 2024-10-02 ENCOUNTER — Encounter: Admission: EM | Disposition: A | Payer: Self-pay | Source: Home / Self Care | Attending: Emergency Medicine

## 2024-10-02 ENCOUNTER — Observation Stay
Admission: EM | Admit: 2024-10-02 | Discharge: 2024-10-03 | Disposition: A | Discharge status: Home / Self Care | Attending: Surgery | Admitting: Surgery

## 2024-10-02 DIAGNOSIS — K810 Acute cholecystitis: Secondary | ICD-10-CM

## 2024-10-02 DIAGNOSIS — R079 Chest pain, unspecified: Secondary | ICD-10-CM | POA: Insufficient documentation

## 2024-10-02 DIAGNOSIS — K8020 Calculus of gallbladder without cholecystitis without obstruction: Principal | ICD-10-CM | POA: Diagnosis present

## 2024-10-02 DIAGNOSIS — K8012 Calculus of gallbladder with acute and chronic cholecystitis without obstruction: Principal | ICD-10-CM | POA: Insufficient documentation

## 2024-10-02 DIAGNOSIS — R1011 Right upper quadrant pain: Secondary | ICD-10-CM | POA: Diagnosis present

## 2024-10-02 LAB — CBC
HCT: 45.3 % (ref 36.0–46.0)
Hemoglobin: 14.5 g/dL (ref 12.0–15.0)
MCH: 27.8 pg (ref 26.0–34.0)
MCHC: 32 g/dL (ref 30.0–36.0)
MCV: 86.9 fL (ref 80.0–100.0)
Platelets: 278 K/uL (ref 150–400)
RBC: 5.21 MIL/uL — ABNORMAL HIGH (ref 3.87–5.11)
RDW: 13.4 % (ref 11.5–15.5)
WBC: 9 K/uL (ref 4.0–10.5)
nRBC: 0 % (ref 0.0–0.2)

## 2024-10-02 LAB — URINALYSIS, ROUTINE W REFLEX MICROSCOPIC
Bacteria, UA: NONE SEEN
Bilirubin Urine: NEGATIVE
Glucose, UA: NEGATIVE mg/dL
Hgb urine dipstick: NEGATIVE
Ketones, ur: NEGATIVE mg/dL
Leukocytes,Ua: NEGATIVE
Nitrite: NEGATIVE
Protein, ur: NEGATIVE mg/dL
Specific Gravity, Urine: 1.016 (ref 1.005–1.030)
WBC, UA: 0 WBC/hpf (ref 0–5)
pH: 8 (ref 5.0–8.0)

## 2024-10-02 LAB — COMPREHENSIVE METABOLIC PANEL WITH GFR
ALT: 16 U/L (ref 0–44)
AST: 24 U/L (ref 15–41)
Albumin: 4.6 g/dL (ref 3.5–5.0)
Alkaline Phosphatase: 81 U/L (ref 38–126)
Anion gap: 11 (ref 5–15)
BUN: 13 mg/dL (ref 6–20)
CO2: 23 mmol/L (ref 22–32)
Calcium: 10.1 mg/dL (ref 8.9–10.3)
Chloride: 105 mmol/L (ref 98–111)
Creatinine, Ser: 0.73 mg/dL (ref 0.44–1.00)
GFR, Estimated: >60 mL/min
Glucose, Bld: 136 mg/dL — ABNORMAL HIGH (ref 70–99)
Potassium: 4.3 mmol/L (ref 3.5–5.1)
Sodium: 139 mmol/L (ref 135–145)
Total Bilirubin: 0.4 mg/dL (ref 0.0–1.2)
Total Protein: 7.3 g/dL (ref 6.5–8.1)

## 2024-10-02 LAB — D-DIMER, QUANTITATIVE: D-Dimer, Quant: 0.38 ug{FEU}/mL (ref 0.00–0.50)

## 2024-10-02 LAB — TROPONIN T, HIGH SENSITIVITY
Troponin T High Sensitivity: <15 ng/L (ref 0–19)
Troponin T High Sensitivity: <15 ng/L (ref 0–19)

## 2024-10-02 LAB — LIPASE, BLOOD: Lipase: 46 U/L (ref 11–51)

## 2024-10-02 MED ORDER — BUPIVACAINE-EPINEPHRINE (PF) 0.25% -1:200000 IJ SOLN
INTRAMUSCULAR | Status: DC | PRN
  Administered 2024-10-02: 30 mL via PERINEURAL

## 2024-10-02 MED ORDER — ACETAMINOPHEN 10 MG/ML IV SOLN
INTRAVENOUS | Status: DC | PRN
  Administered 2024-10-02: 1000 mg via INTRAVENOUS

## 2024-10-02 MED ORDER — EPHEDRINE 5 MG/ML INJ
INTRAVENOUS | Status: AC
  Filled 2024-10-02: qty 5

## 2024-10-02 MED ORDER — MORPHINE SULFATE (PF) 4 MG/ML IV SOLN
4.0000 mg | Freq: Once | INTRAVENOUS | Status: AC
  Administered 2024-10-02: 4 mg via INTRAVENOUS
  Filled 2024-10-02: qty 1

## 2024-10-02 MED ORDER — SODIUM CHLORIDE 0.9 % IR SOLN
Status: DC | PRN
  Administered 2024-10-02: 100 mL

## 2024-10-02 MED ORDER — OXYCODONE HCL 5 MG PO TABS
ORAL_TABLET | ORAL | Status: AC
  Filled 2024-10-02: qty 1

## 2024-10-02 MED ORDER — CEFAZOLIN SODIUM-DEXTROSE 2-3 GM-%(50ML) IV SOLR
INTRAVENOUS | Status: DC | PRN
  Administered 2024-10-02: 2 g via INTRAVENOUS

## 2024-10-02 MED ORDER — EPHEDRINE SULFATE-NACL 50-0.9 MG/10ML-% IV SOSY
PREFILLED_SYRINGE | INTRAVENOUS | Status: DC | PRN
  Administered 2024-10-02: 10 mg via INTRAVENOUS

## 2024-10-02 MED ORDER — PROPOFOL 10 MG/ML IV BOLUS
INTRAVENOUS | Status: AC
  Filled 2024-10-02: qty 20

## 2024-10-02 MED ORDER — PROPOFOL 1000 MG/100ML IV EMUL
INTRAVENOUS | Status: AC
  Filled 2024-10-02: qty 100

## 2024-10-02 MED ORDER — ONDANSETRON HCL 4 MG/2ML IJ SOLN
INTRAMUSCULAR | Status: DC | PRN
  Administered 2024-10-02: 4 mg via INTRAVENOUS

## 2024-10-02 MED ORDER — SODIUM CHLORIDE 0.9 % IV SOLN
2.0000 g | INTRAVENOUS | Status: DC
  Administered 2024-10-02: 2 g via INTRAVENOUS
  Filled 2024-10-02 (×2): qty 20

## 2024-10-02 MED ORDER — SUGAMMADEX SODIUM 200 MG/2ML IV SOLN
INTRAVENOUS | Status: DC | PRN
  Administered 2024-10-02: 180 mg via INTRAVENOUS

## 2024-10-02 MED ORDER — ONDANSETRON HCL 4 MG/2ML IJ SOLN: 4.0000 mg | Freq: Four times a day (QID) | INTRAMUSCULAR | Status: DC | PRN

## 2024-10-02 MED ORDER — SODIUM CHLORIDE 0.9 % IV BOLUS (SEPSIS)
1000.0000 mL | Freq: Once | INTRAVENOUS | Status: AC
  Administered 2024-10-02: 1000 mL via INTRAVENOUS

## 2024-10-02 MED ORDER — MIDAZOLAM HCL 2 MG/2ML IJ SOLN
INTRAMUSCULAR | Status: AC
  Filled 2024-10-02: qty 2

## 2024-10-02 MED ORDER — PANTOPRAZOLE SODIUM 40 MG IV SOLR
40.0000 mg | Freq: Once | INTRAVENOUS | Status: AC
  Administered 2024-10-02: 40 mg via INTRAVENOUS
  Filled 2024-10-02: qty 10

## 2024-10-02 MED ORDER — ONDANSETRON HCL 4 MG/2ML IJ SOLN
4.0000 mg | Freq: Once | INTRAMUSCULAR | Status: AC
  Administered 2024-10-02: 4 mg via INTRAVENOUS
  Filled 2024-10-02: qty 2

## 2024-10-02 MED ORDER — MIDAZOLAM HCL (PF) 2 MG/2ML IJ SOLN
INTRAMUSCULAR | Status: DC | PRN
  Administered 2024-10-02: 2 mg via INTRAVENOUS

## 2024-10-02 MED ORDER — ROCURONIUM BROMIDE 10 MG/ML (PF) SYRINGE
PREFILLED_SYRINGE | INTRAVENOUS | Status: AC
  Filled 2024-10-02: qty 10

## 2024-10-02 MED ORDER — PROPOFOL 10 MG/ML IV BOLUS
INTRAVENOUS | Status: DC | PRN
  Administered 2024-10-02: 110 mg via INTRAVENOUS

## 2024-10-02 MED ORDER — KETOROLAC TROMETHAMINE 30 MG/ML IJ SOLN
30.0000 mg | Freq: Once | INTRAMUSCULAR | Status: AC
  Administered 2024-10-02: 30 mg via INTRAVENOUS
  Filled 2024-10-02: qty 1

## 2024-10-02 MED ORDER — KETOROLAC TROMETHAMINE 30 MG/ML IJ SOLN
INTRAMUSCULAR | Status: DC | PRN
  Administered 2024-10-02: 15 mg via INTRAVENOUS

## 2024-10-02 MED ORDER — HYDROMORPHONE HCL 1 MG/ML IJ SOLN
INTRAMUSCULAR | Status: AC
  Filled 2024-10-02: qty 1

## 2024-10-02 MED ORDER — LIDOCAINE HCL (PF) 2 % IJ SOLN
INTRAMUSCULAR | Status: AC
  Filled 2024-10-02: qty 5

## 2024-10-02 MED ORDER — LIDOCAINE HCL (CARDIAC) PF 100 MG/5ML IV SOSY
PREFILLED_SYRINGE | INTRAVENOUS | Status: DC | PRN
  Administered 2024-10-02: 70 mg via INTRAVENOUS

## 2024-10-02 MED ORDER — 0.9 % SODIUM CHLORIDE (POUR BTL) OPTIME
TOPICAL | Status: DC | PRN
  Administered 2024-10-02: 500 mL

## 2024-10-02 MED ORDER — SPY AGENT GREEN - (INDOCYANINE FOR INJECTION)
1.2500 mg | Freq: Once | INTRAMUSCULAR | Status: AC
  Administered 2024-10-02: 1.25 mg via INTRAVENOUS
  Filled 2024-10-02: qty 10

## 2024-10-02 MED ORDER — OXYCODONE HCL 5 MG PO TABS
5.0000 mg | ORAL_TABLET | ORAL | Status: DC | PRN
  Administered 2024-10-02 (×2): 5 mg via ORAL
  Filled 2024-10-02 (×2): qty 1

## 2024-10-02 MED ORDER — FENTANYL CITRATE (PF) 100 MCG/2ML IJ SOLN: 25.0000 ug | INTRAMUSCULAR | Status: DC | PRN

## 2024-10-02 MED ORDER — DEXAMETHASONE SOD PHOSPHATE PF 10 MG/ML IJ SOLN
INTRAMUSCULAR | Status: AC
  Filled 2024-10-02: qty 1

## 2024-10-02 MED ORDER — ACETAMINOPHEN 500 MG PO TABS
1000.0000 mg | ORAL_TABLET | Freq: Four times a day (QID) | ORAL | Status: DC
  Administered 2024-10-02 – 2024-10-03 (×3): 1000 mg via ORAL
  Filled 2024-10-02 (×3): qty 2

## 2024-10-02 MED ORDER — SODIUM CHLORIDE 0.9 % IV SOLN: INTRAVENOUS | Status: DC

## 2024-10-02 MED ORDER — ONDANSETRON 4 MG PO TBDP: 4.0000 mg | ORAL_TABLET | Freq: Four times a day (QID) | ORAL | Status: DC | PRN

## 2024-10-02 MED ORDER — CEFAZOLIN SODIUM 1 G IJ SOLR
INTRAMUSCULAR | Status: AC
  Filled 2024-10-02: qty 20

## 2024-10-02 MED ORDER — BUPIVACAINE-EPINEPHRINE (PF) 0.25% -1:200000 IJ SOLN
INTRAMUSCULAR | Status: AC
  Filled 2024-10-02: qty 30

## 2024-10-02 MED ORDER — ONDANSETRON HCL 4 MG/2ML IJ SOLN
INTRAMUSCULAR | Status: AC
  Filled 2024-10-02: qty 2

## 2024-10-02 MED ORDER — KETOROLAC TROMETHAMINE 30 MG/ML IJ SOLN
INTRAMUSCULAR | Status: AC
  Filled 2024-10-02: qty 1

## 2024-10-02 MED ORDER — SODIUM CHLORIDE 0.9 % IV BOLUS (SEPSIS): 1000.0000 mL | Freq: Once | INTRAVENOUS | Status: DC

## 2024-10-02 MED ORDER — DEXAMETHASONE SOD PHOSPHATE PF 10 MG/ML IJ SOLN
INTRAMUSCULAR | Status: DC | PRN
  Administered 2024-10-02: 10 mg via INTRAVENOUS

## 2024-10-02 MED ORDER — LACTATED RINGERS IV SOLN: INTRAVENOUS | Status: DC | PRN

## 2024-10-02 MED ORDER — ROCURONIUM BROMIDE 100 MG/10ML IV SOLN
INTRAVENOUS | Status: DC | PRN
  Administered 2024-10-02: 50 mg via INTRAVENOUS

## 2024-10-02 MED ORDER — FENTANYL CITRATE (PF) 100 MCG/2ML IJ SOLN
INTRAMUSCULAR | Status: AC
  Filled 2024-10-02: qty 2

## 2024-10-02 MED ORDER — ACETAMINOPHEN 10 MG/ML IV SOLN
INTRAVENOUS | Status: AC
  Filled 2024-10-02: qty 100

## 2024-10-02 MED ORDER — OXYCODONE HCL 5 MG PO TABS
5.0000 mg | ORAL_TABLET | Freq: Once | ORAL | Status: AC | PRN
  Administered 2024-10-02: 5 mg via ORAL

## 2024-10-02 MED ORDER — INDOCYANINE GREEN 25 MG IJ SOLR
INTRAMUSCULAR | Status: AC
  Filled 2024-10-02: qty 10

## 2024-10-02 MED ORDER — HYDROMORPHONE HCL 1 MG/ML IJ SOLN
0.5000 mg | INTRAMUSCULAR | Status: DC | PRN
  Administered 2024-10-02: 1 mg via INTRAVENOUS

## 2024-10-02 MED ORDER — OXYCODONE HCL 5 MG/5ML PO SOLN: 5.0000 mg | Freq: Once | ORAL | Status: AC | PRN

## 2024-10-02 NOTE — Anesthesia Preprocedure Evaluation (Signed)
 "                                  Anesthesia Evaluation  Patient identified by MRN, date of birth, ID band Patient awake    Reviewed: Allergy & Precautions, NPO status , Patient's Chart, lab work & pertinent test results  History of Anesthesia Complications Negative for: history of anesthetic complications  Airway Mallampati: II  TM Distance: >3 FB Neck ROM: full    Dental no notable dental hx.    Pulmonary neg pulmonary ROS   Pulmonary exam normal        Cardiovascular negative cardio ROS Normal cardiovascular exam     Neuro/Psych  Neuromuscular disease  negative psych ROS   GI/Hepatic Neg liver ROS,GERD  Medicated,,  Endo/Other  negative endocrine ROS    Renal/GU negative Renal ROS  negative genitourinary   Musculoskeletal  (+) Arthritis ,    Abdominal   Peds  Hematology negative hematology ROS (+)   Anesthesia Other Findings Past Medical History: No date: Arthritis No date: Blood transfusion without reported diagnosis No date: Carpal tunnel syndrome     Comment:  left 08/27/2021: Dysplastic nevus     Comment:  left lower back post waistline- severe, excised 10/27/21 No date: GERD (gastroesophageal reflux disease)     Comment:  rare No date: Motion sickness     Comment:  boats No date: Urine incontinence  Past Surgical History: No date: AUGMENTATION MAMMAPLASTY; Bilateral     Comment:  saline No date: BARTHOLIN GLAND CYST EXCISION No date: BREAST ENHANCEMENT SURGERY     Comment:  Transumbilical augmentation 10/24/2015: CARPAL TUNNEL RELEASE; Left     Comment:  Procedure: LEFT OPEN CARPAL TUNNEL RELEASE;  Surgeon:               Helayne Glenn, MD;  Location: [facility];                Service: Orthopedics;  Laterality: Left; No date: DILATION AND CURETTAGE OF UTERUS No date: LEG SURGERY; Right     Comment:  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  09/08/2016: SHOULDER ARTHROSCOPY; Right     Comment:  Procedure:  ARTHROSCOPY SHOULDER WITH DEBRIDEMENT,               DECOMPRESSION;  Surgeon: Norleen JINNY Maltos, MD;  Location:               [facility];  Service: Orthopedics;  Laterality:               Right; 03/20/2019: SHOULDER ARTHROSCOPY WITH SUBACROMIAL DECOMPRESSION; Left     Comment:  Procedure: Limited arthroscopic debridement,               arthroscopic subacromial decompression, and manipulation               under anesthesia, left shoulder;  Surgeon: Maltos Norleen JINNY,              MD;  Location: [facility];  Service: Orthopedics;                Laterality: Left; 10/24/2015: TRIGGER FINGER RELEASE; Right     Comment:  Procedure: RIGHT HAND MIDDLE RELEASE TRIGGER FINGER/A-1               PULLEY;  Surgeon: Helayne Glenn, MD;  Location: [facility];  Service: Orthopedics;  Laterality: Right;  BMI    Body Mass Index: 27.46 kg/m      Reproductive/Obstetrics negative OB ROS                              Anesthesia Physical Anesthesia Plan  ASA: 2  Anesthesia Plan: General ETT   Post-op Pain Management: Ofirmev  IV (intra-op)*, Dilaudid  IV, Tylenol  PO (pre-op)* and Ketamine IV*   Induction: Intravenous  PONV Risk Score and Plan: 3 and Ondansetron , Dexamethasone , Midazolam  and Treatment may vary due to age or medical condition  Airway Management Planned: Oral ETT  Additional Equipment:   Intra-op Plan:   Post-operative Plan: Extubation in OR  Informed Consent: I have reviewed the patients History and Physical, chart, labs and discussed the procedure including the risks, benefits and alternatives for the proposed anesthesia with the patient or authorized representative who has indicated his/her understanding and acceptance.     Dental Advisory Given  Plan Discussed with: Anesthesiologist, CRNA and Surgeon  Anesthesia Plan Comments: (Patient consented for risks of anesthesia including but not limited to:  - adverse reactions to  medications - damage to eyes, teeth, lips or other oral mucosa - nerve damage due to positioning  - sore throat or hoarseness - Damage to heart, brain, nerves, lungs, other parts of body or loss of life  Patient voiced understanding and assent.)        Anesthesia Quick Evaluation  "

## 2024-10-02 NOTE — ED Triage Notes (Signed)
 Patient ambulatory to triage with steady gait, without difficulty or distress noted; pt reports since 530pm having left sided CP, nonradiating accomp by nausea; also reports some left lower flank pain; denies hx of same

## 2024-10-02 NOTE — ED Notes (Signed)
 US  at bedside.

## 2024-10-02 NOTE — Anesthesia Procedure Notes (Signed)
 Procedure Name: Intubation Date/Time: 10/02/2024 5:18 PM  Performed by: Dyane Mass, CRNAPre-anesthesia Checklist: Patient identified, Emergency Drugs available, Suction available and Patient being monitored Patient Re-evaluated:Patient Re-evaluated prior to induction Oxygen Delivery Method: Circle system utilized Preoxygenation: Pre-oxygenation with 100% oxygen Induction Type: IV induction Ventilation: Mask ventilation without difficulty Laryngoscope Size: McGrath and 3 Grade View: Grade I Tube type: Oral Tube size: 7.0 mm Number of attempts: 1 Airway Equipment and Method: Oral airway and Stylet Placement Confirmation: ETT inserted through vocal cords under direct vision, positive ETCO2 and breath sounds checked- equal and bilateral Secured at: 19 cm Tube secured with: Tape Dental Injury: Teeth and Oropharynx as per pre-operative assessment

## 2024-10-02 NOTE — ED Provider Notes (Signed)
 "  [facility] Provider Note    Event Date/Time   First MD Initiated Contact with Patient 10/02/24 [phone]     (approximate)   History   Chest Pain   HPI  Hayley Jacobson is a 59 y.o. female with history of GERD who presents to the emergency department with left-sided flank pain, left-sided chest pain, upper abdominal pain.  Has some shortness of breath.  No prior history of PE or DVT.  Pain was mild in nature earlier today but then suddenly increased and she has had nausea and vomiting.  No diarrhea.  No bloody stool or melena.  No calf swelling or calf tenderness.  No injury to the back.  Denies any prior history of kidney stone.  No dysuria or hematuria.   History provided by patient and husband.    Past Medical History:  Diagnosis Date   Arthritis    Blood transfusion without reported diagnosis    Carpal tunnel syndrome    left   Dysplastic nevus 08/27/2021   left lower back post waistline- severe, excised 10/27/21   GERD (gastroesophageal reflux disease)    rare   Motion sickness    boats   Urine incontinence     Past Surgical History:  Procedure Laterality Date   AUGMENTATION MAMMAPLASTY Bilateral    saline   BARTHOLIN GLAND CYST EXCISION     BREAST ENHANCEMENT SURGERY     Transumbilical augmentation   CARPAL TUNNEL RELEASE Left 10/24/2015   Procedure: LEFT OPEN CARPAL TUNNEL RELEASE;  Surgeon: Helayne Glenn, MD;  Location: [facility];  Service: Orthopedics;  Laterality: Left;   DILATION AND CURETTAGE OF UTERUS     LEG SURGERY Right    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    SHOULDER ARTHROSCOPY Right 09/08/2016   Procedure: ARTHROSCOPY SHOULDER WITH DEBRIDEMENT, DECOMPRESSION;  Surgeon: Norleen JINNY Maltos, MD;  Location: [facility];  Service: Orthopedics;  Laterality: Right;   SHOULDER ARTHROSCOPY WITH SUBACROMIAL DECOMPRESSION Left 03/20/2019   Procedure: Limited arthroscopic debridement, arthroscopic  subacromial decompression, and manipulation under anesthesia, left shoulder;  Surgeon: Maltos Norleen JINNY, MD;  Location: [facility];  Service: Orthopedics;  Laterality: Left;   TRIGGER FINGER RELEASE Right 10/24/2015   Procedure: RIGHT HAND MIDDLE RELEASE TRIGGER FINGER/A-1 PULLEY;  Surgeon: Helayne Glenn, MD;  Location: [facility];  Service: Orthopedics;  Laterality: Right;    MEDICATIONS:  Prior to Admission medications  Medication Sig Start Date End Date Taking? Authorizing Provider  Calcium Carbonate-Vitamin D  (CALTRATE 600+D PO) Take 1 tablet by mouth daily.    [provider]  Cholecalciferol (VITAMIN D3) 50 MCG (2000 UT) CAPS Take 1 capsule by mouth daily.    [provider]  clindamycin -benzoyl peroxide (BENZACLIN) gel Apply to acne bumps once or twice a day 09/04/24   Hester Alm BROCKS, MD  ESTRADIOL VA Place 1 Applicatorful vaginally 2 (two) times a week. Patient not taking: Reported on 09/04/2024    [provider]  gabapentin (NEURONTIN) 100 MG capsule SMARTSIG:1 Capsule(s) By Mouth Every Night PRN Patient not taking: Reported on 09/04/2024 01/28/22   [provider]  mupirocin  ointment (BACTROBAN ) 2 % Apply 1 application topically daily. Qd to excision site 10/27/21   Hester Alm BROCKS, MD  Vitamin D , Ergocalciferol , (DRISDOL ) 1.25 MG (50000 UNIT) CAPS capsule TAKE 1 CAPSULE BY MOUTH ONE TIME PER WEEK 05/16/22   Webb, Padonda B, FNP  zinc gluconate 50 MG tablet Take 50 mg by mouth daily.  [provider]    Physical Exam   Triage Vital Signs: ED Triage Vitals  Encounter Vitals Group     BP 10/02/24 0024 129/81     Girls Systolic BP Percentile --      Girls Diastolic BP Percentile --      Boys Systolic BP Percentile --      Boys Diastolic BP Percentile --      Pulse Rate 10/02/24 0024 69     Resp --      Temp 10/02/24 0024 98.2 F (36.8 C)     Temp Source 10/02/24 0024 Oral     SpO2 10/02/24 0024 99 %     Weight 10/02/24  0020 155 lb (70.3 kg)     Height 10/02/24 0020 5' 3 (1.6 m)     Head Circumference --      Peak Flow --      Pain Score 10/02/24 0019 10     Pain Loc --      Pain Education --      Exclude from Growth Chart --     Most recent vital signs: Vitals:   10/02/24 0615 10/02/24 0632  BP:  122/73  Pulse: 65 64  Resp:  14  Temp:  (!) 97.5 F (36.4 C)  SpO2:  99%    CONSTITUTIONAL: Alert, responds appropriately to questions.  Appears uncomfortable, patient is on her side in fetal position, moaning in discomfort HEAD: Normocephalic, atraumatic EYES: Conjunctivae clear, pupils appear equal, sclera nonicteric ENT: normal nose; moist mucous membranes NECK: Supple, normal ROM CARD: RRR; S1 and S2 appreciated RESP: Normal chest excursion without splinting or tachypnea; breath sounds clear and equal bilaterally; no wheezes, no rhonchi, no rales, no hypoxia or respiratory distress, speaking full sentences ABD/GI: Non-distended; soft, non-tender, no rebound, no guarding, no peritoneal signs BACK: The back appears normal, left flank tenderness, no overlying skin changes or rash, no midline spinal tenderness or step-off or deformity EXT: Normal ROM in all joints; no deformity noted, no edema, no calf tenderness or calf swelling SKIN: Normal color for age and race; warm; no rash on exposed skin NEURO: Moves all extremities equally, normal speech PSYCH: The patient's mood and manner are appropriate.   ED Results / Procedures / Treatments   LABS: (all labs ordered are listed, but only abnormal results are displayed) Labs Reviewed  CBC - Abnormal; Notable for the following components:      Result Value   RBC 5.21 (*)    All other components within normal limits  COMPREHENSIVE METABOLIC PANEL WITH GFR - Abnormal; Notable for the following components:   Glucose, Bld 136 (*)    All other components within normal limits  URINALYSIS, ROUTINE W REFLEX MICROSCOPIC - Abnormal; Notable for the  following components:   Color, Urine YELLOW (*)    APPearance TURBID (*)    All other components within normal limits  D-DIMER, QUANTITATIVE  LIPASE, BLOOD  TROPONIN T, HIGH SENSITIVITY  TROPONIN T, HIGH SENSITIVITY     EKG:  EKG Interpretation Date/Time:  Tuesday October 02 2024 00:19:51 EST Ventricular Rate:  65 PR Interval:  168 QRS Duration:  72 QT Interval:  394 QTC Calculation: 409 R Axis:   92  Text Interpretation: Normal sinus rhythm Rightward axis Borderline ECG No previous ECGs available Confirmed by Neomi Neptune [id number]) on 10/02/2024 4:02:59 AM         RADIOLOGY: My personal review and interpretation of imaging: Ultrasound shows cholelithiasis without obvious signs of  cholecystitis.  No ureterolithiasis.  Chest x-ray clear.  I have personally reviewed all radiology reports.   US  ABDOMEN LIMITED RUQ (LIVER/GB) Result Date: 10/02/2024 EXAM: Right Upper Quadrant Abdominal Ultrasound 10/02/2024 06:38:00 AM TECHNIQUE: Real-time ultrasonography of the right upper quadrant of the abdomen was performed. COMPARISON: CT from earlier today. CLINICAL HISTORY: Flank pain and a history of gallstones. FINDINGS: LIVER: Normal echogenicity. No intrahepatic biliary ductal dilatation. No evidence of mass. Hepatopetal flow in the portal vein. BILIARY SYSTEM: The gallbladder is distended. There is a stone within the gallbladder neck measuring 2.2 cm. No gallbladder wall thickening, pericholecystic fluid, or positive sonographic Murphy sign. The common bile duct measures 3.9 mm. T OTHER: No right upper quadrant ascites. IMPRESSION: 1. Gallbladder distention with a 2.2 cm stone in the gallbladder neck, most consistent with cholelithiasis with possible intermittent cystic duct obstruction or biliary colic, with early acute cholecystitis considered less likely given absent inflammatory findings. 2. If symptoms persist or there is ongoing concern for acute cholecystitis/cystic duct obstruction,  consider hepatobiliary (HIDA) scan. Electronically signed by: Waddell Calk MD MD 10/02/2024 06:53 AM EST RP Workstation: [id number]   CT Renal Stone Study Result Date: 10/02/2024 EXAM: CT ABDOMEN AND PELVIS WITHOUT CONTRAST 10/02/2024 04:46:43 AM TECHNIQUE: CT of the abdomen and pelvis was performed without the administration of intravenous contrast. Multiplanar reformatted images are provided for review. Automated exposure control, iterative reconstruction, and/or weight-based adjustment of the mA/kV was utilized to reduce the radiation dose to as low as reasonably achievable. COMPARISON: None available. CLINICAL HISTORY: Abdominal/flank pain, stone suspected. FINDINGS: LOWER CHEST: No acute abnormality. LIVER: The liver is unremarkable. GALLBLADDER AND BILE DUCTS: There is a calculus within the neck of the gallbladder measuring approximately 2.1 x 1.7 x 2.5 cm. The gallbladder is also mildly distended, measuring up to 5.3 cm in diameter and approximately 11 cm in length. There is no apparent pericholecystic fluid or inflammatory change. No biliary ductal dilatation. SPLEEN: No acute abnormality. PANCREAS: No acute abnormality. ADRENAL GLANDS: No acute abnormality. KIDNEYS, URETERS AND BLADDER: There is a 4 mm nonobstructive calculus present within the lower pole of the left kidney. There is no evidence of obstructive uropathy. No stones in the ureters. No hydronephrosis. No perinephric or periureteral stranding. Urinary bladder is unremarkable. GI AND BOWEL: Stomach demonstrates no acute abnormality. There is no bowel obstruction. PERITONEUM AND RETROPERITONEUM: No ascites. No free air. VASCULATURE: Aorta is normal in caliber. LYMPH NODES: No lymphadenopathy. REPRODUCTIVE ORGANS: No acute abnormality. BONES AND SOFT TISSUES: No acute osseous abnormality. No focal soft tissue abnormality. IMPRESSION: 1. 4 mm nonobstructive calculus in the lower pole of the left kidney. No evidence of obstructive uropathy. 2.  Gallbladder calculus within the neck measuring approximately 2.1 x 1.7 x 2.5 cm with mild gallbladder distention. No pericholecystic fluid or inflammatory change. Electronically signed by: Evalene Coho MD MD 10/02/2024 05:09 AM EST RP Workstation: [id number]   DG Chest 2 View Result Date: 10/02/2024 EXAM: 2 VIEW(S) XRAY OF THE CHEST 10/02/2024 01:00:00 AM COMPARISON: None available. CLINICAL HISTORY: CP FINDINGS: LUNGS AND PLEURA: No focal pulmonary opacity. No pleural effusion. No pneumothorax. HEART AND MEDIASTINUM: No acute abnormality of the cardiac and mediastinal silhouettes. BONES AND SOFT TISSUES: No acute osseous abnormality. IMPRESSION: 1. No acute process. Electronically signed by: Oneil Devonshire MD MD 10/02/2024 01:05 AM EST RP Workstation: [id number]     PROCEDURES:  Critical Care performed: No    .1-3 Lead EKG Interpretation  Performed by: Sten Dematteo, Josette SAILOR, DO Authorized by: Sam Overbeck, Josette  N, DO     Interpretation: normal     ECG rate:  69   ECG rate assessment: normal     Rhythm: sinus rhythm     Ectopy: none     Conduction: normal       IMPRESSION / MDM / ASSESSMENT AND PLAN / ED COURSE  I reviewed the triage vital signs and the nursing notes.    Patient here with left flank pain, left chest pain, shortness of breath.  The patient is on the cardiac monitor to evaluate for evidence of arrhythmia and/or significant heart rate changes.   DIFFERENTIAL DIAGNOSIS (includes but not limited to):   Kidney stone, UTI, pyelonephritis, PE, ACS, dissection, pericarditis, myocarditis, musculoskeletal pain   Patient's presentation is most consistent with acute presentation with potential threat to life or bodily function.   PLAN: Workup initiated from triage.  No leukocytosis.  Normal creatinine.  Normal LFTs.  First troponin negative.  Will obtain CT renal study.  Urine shows no sign of infection, blood.  Will also obtain D-dimer to rule out PE.  Will give pain and nausea  medicine.   MEDICATIONS GIVEN IN ED: Medications  morphine  (PF) 4 MG/ML injection 4 mg (has no administration in time range)  pantoprazole  (PROTONIX ) injection 40 mg (has no administration in time range)  ondansetron  (ZOFRAN ) injection 4 mg (has no administration in time range)  0.9 %  sodium chloride  infusion (has no administration in time range)  morphine  (PF) 4 MG/ML injection 4 mg (4 mg Intravenous Given 10/02/24 0430)  ketorolac  (TORADOL ) 30 MG/ML injection 30 mg (30 mg Intravenous Given 10/02/24 0429)  ondansetron  (ZOFRAN ) injection 4 mg (4 mg Intravenous Given 10/02/24 0427)  sodium chloride  0.9 % bolus 1,000 mL (0 mLs Intravenous Stopped 10/02/24 0538)  morphine  (PF) 4 MG/ML injection 4 mg (4 mg Intravenous Given 10/02/24 0538)     ED COURSE: 5:19 AM  CT scan shows nonobstructing stone in the lower pole of the kidney but no ureterolithiasis or hydronephrosis.  She does have a large gallbladder calculus within the neck of the gallbladder with gallbladder distention but no Perry cholecystic fluid.  Will obtain ultrasound for further evaluation.  She does have some tenderness in the right upper quadrant.  She states earlier she was in so much pain it was very difficult for her to tell where the pain was coming from.  We discussed this could be the source of her symptoms today and have recommended that she stay NPO.  Reports pain has significantly improved but still describes as a dull ache.  Will give additional pain medication.  6:14 AM  Pt's second troponin is negative.  D-dimer negative.  7:00 AM  Pt's right upper quadrant ultrasound shows large stone in the gallbladder neck.  No pericholecystic fluid or wall thickening suggestive of cholecystitis but patient is now starting to have return of her pain and nausea.  Will discuss with general surgery as she will likely need cholecystectomy for symptomatic management.  Will continue hydration and give additional pain and nausea medications.   Will continue to keep her NPO.   CONSULTS:  7:10 AM  D/w Dr Jordis with general surgery who will see patient for admission for management of symptomatic cholelithiasis.   OUTSIDE RECORDS REVIEWED: Reviewed orthopedic notes in 2020.       FINAL CLINICAL IMPRESSION(S) / ED DIAGNOSES   Final diagnoses:  Nonspecific chest pain  Gallstones     Rx / DC Orders   ED Discharge  Orders     None        Note:  This document was prepared using Dragon voice recognition software and may include unintentional dictation errors.   Virjean Boman, Josette SAILOR, DO 10/02/24 [phone]  "

## 2024-10-02 NOTE — ED Notes (Signed)
 Patient transported to CT

## 2024-10-02 NOTE — H&P (Signed)
 [facility] SURGICAL HISTORY & PHYSICAL (cpt [id number])  HISTORY OF PRESENT ILLNESS (HPI):  59 y.o. female presented to [facility] ED overnight for abdominal pain and back pain. Patient reports the acute onset of upper back pain last night while making dinner. This radiated to her shoulder and upper abdomen. Ultimately, this was accompanied by nausea and emesis. No fever, chills, CP, SOB, urinary changes, bowel changes, nor juandice. She denied any history of similar or previous known gallbladder disease. No previous abdominal surgeries. Laboratory work up in the ED was reassuring. Renal CT showed large gallstone without significant changes to suggest cholecystitis. RUQ US  was similar.   General surgery is consulted by emergency medicine physician Dr Josette Sink, DO for evaluation and management of symptomatic cholelithiasis.   PAST MEDICAL HISTORY (PMH):  Past Medical History:  Diagnosis Date   Arthritis    Blood transfusion without reported diagnosis    Carpal tunnel syndrome    left   Dysplastic nevus 08/27/2021   left lower back post waistline- severe, excised 10/27/21   GERD (gastroesophageal reflux disease)    rare   Motion sickness    boats   Urine incontinence     Reviewed. Otherwise negative.   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    SHOULDER ARTHROSCOPY Right 09/08/2016   Procedure: ARTHROSCOPY SHOULDER WITH DEBRIDEMENT, DECOMPRESSION;  Surgeon: Norleen JINNY Maltos, MD;  Location: [facility];  Service: Orthopedics;  Laterality: Right;   SHOULDER ARTHROSCOPY WITH SUBACROMIAL DECOMPRESSION Left 03/20/2019   Procedure: Limited arthroscopic debridement, arthroscopic subacromial decompression, and manipulation under anesthesia, left shoulder;  Surgeon: Maltos Norleen JINNY, MD;  Location: [facility];  Service: Orthopedics;  Laterality: Left;   TRIGGER FINGER RELEASE Right 10/24/2015   Procedure: RIGHT HAND MIDDLE RELEASE TRIGGER FINGER/A-1 PULLEY;  Surgeon: Helayne Glenn, MD;  Location: [facility];  Service: Orthopedics;  Laterality: Right;    Reviewed. Otherwise negative.   MEDICATIONS:  Prior to Admission medications  Medication Sig Start Date End Date Taking? Authorizing Provider  Calcium Carbonate-Vitamin D  (CALTRATE 600+D PO) Take 1 tablet by mouth daily.    [provider]  Cholecalciferol (VITAMIN D3) 50 MCG (2000 UT) CAPS Take 1 capsule by mouth daily.    [provider]  clindamycin -benzoyl peroxide (BENZACLIN) gel Apply to acne bumps once or twice a day 09/04/24   Hester Alm BROCKS, MD  ESTRADIOL VA Place 1 Applicatorful vaginally 2 (two) times a week. Patient not taking: Reported on 09/04/2024    [provider]  gabapentin (NEURONTIN) 100 MG capsule SMARTSIG:1 Capsule(s) By Mouth Every Night PRN Patient not taking: Reported on 09/04/2024 01/28/22   [provider]  mupirocin  ointment (BACTROBAN ) 2 % Apply 1 application topically daily. Qd to excision site 10/27/21   Hester Alm BROCKS, MD  Vitamin D , Ergocalciferol , (DRISDOL ) 1.25 MG (50000 UNIT) CAPS capsule TAKE 1 CAPSULE BY MOUTH ONE TIME PER WEEK 05/16/22   Webb, Padonda B, FNP  zinc gluconate 50 MG  tablet Take 50 mg by mouth daily.    [provider]     ALLERGIES:  Allergies[1]   SOCIAL HISTORY:  Social History   Socioeconomic History   Marital status: Married    Spouse name: Not on file   Number of children: Not on file   Years of education: Not on file    Highest education level: Not on file  Occupational History   Not on file  Tobacco Use   Smoking status: Never   Smokeless tobacco: Never  Vaping Use   Vaping status: Never Used  Substance and Sexual Activity   Alcohol use: Yes    Alcohol/week: 1.0 standard drink of alcohol    Types: 1 Glasses of wine per week    Comment: occ   Drug use: Never   Sexual activity: Not on file  Other Topics Concern   Not on file  Social History Narrative   Married  2 grown children,       Social Drivers of Health   Tobacco Use: Low Risk (10/02/2024)   Patient History    Smoking Tobacco Use: Never    Smokeless Tobacco Use: Never    Passive Exposure: Not on file  Financial Resource Strain: Not on file  Food Insecurity: Not on file  Transportation Needs: Not on file  Physical Activity: Not on file  Stress: Not on file  Social Connections: Not on file  Intimate Partner Violence: Not on file  Depression (PHQ2-9): Low Risk (04/21/2022)   Depression (PHQ2-9)    PHQ-2 Score: 0  Alcohol Screen: Not on file  Housing: Not on file  Utilities: Not on file  Health Literacy: Not on file     FAMILY HISTORY:  Family History  Problem Relation Age of Onset   CAD Father        triple bypass in 64's, smoker   CAD Brother        smoker    CAD Paternal Grandfather     Otherwise negative.   REVIEW OF SYSTEMS:  Review of Systems  Constitutional:  Negative for chills and fever.  Respiratory:  Negative for cough and shortness of breath.   Cardiovascular:  Negative for chest pain and palpitations.  Gastrointestinal:  Positive for abdominal pain, nausea and vomiting. Negative for blood in stool, constipation and diarrhea.  Genitourinary:  Negative for dysuria.  Musculoskeletal:  Positive for back pain. Negative for myalgias.  All other systems reviewed and are negative.   VITAL SIGNS:  Temp:  [97.5 F (36.4 C)-98.2 F (36.8 C)] 97.5 F (36.4 C) (01/13 9367) Pulse Rate:  [64-79] 64 (01/13  0632) Resp:  [14-22] 14 (01/13 9367) BP: (122-130)/(72-81) 122/73 (01/13 9367) SpO2:  [99 %-100 %] 99 % (01/13 9367) Weight:  [70.3 kg] 70.3 kg (01/13 0020)     Height: 5' 3 (160 cm) Weight: 70.3 kg BMI (Calculated): 27.46   PHYSICAL EXAM:  Physical Exam Vitals and nursing note reviewed. Exam conducted with a chaperone present.  Constitutional:      General: She is not in acute distress.    Appearance: Normal appearance. She is normal weight. She is not ill-appearing.  HENT:     Head: Normocephalic and atraumatic.  Eyes:     General: No scleral icterus.    Conjunctiva/sclera: Conjunctivae normal.  Cardiovascular:     Rate and Rhythm: Normal rate.     Pulses: Normal pulses.     Heart sounds: No murmur heard. Pulmonary:     Effort: Pulmonary  effort is normal. No respiratory distress.  Abdominal:     General: Abdomen is flat. There is no distension.     Palpations: Abdomen is soft.     Tenderness: There is no abdominal tenderness. There is no guarding or rebound.  Genitourinary:    Comments: Deferred Musculoskeletal:     Right lower leg: No edema.     Left lower leg: No edema.  Skin:    General: Skin is warm and dry.     Coloration: Skin is not jaundiced.  Neurological:     General: No focal deficit present.     Mental Status: She is alert and oriented to person, place, and time.  Psychiatric:        Mood and Affect: Mood normal.        Behavior: Behavior normal.     INTAKE/OUTPUT:  This shift: No intake/output data recorded.  Last 2 shifts: @IOLAST2SHIFTS @  Labs:     Latest Ref Rng & Units 10/02/2024   12:22 AM 04/21/2022    4:03 PM 11/26/2020   11:36 AM  CBC  WBC 4.0 - 10.5 K/uL 9.0  6.2  4.4   Hemoglobin 12.0 - 15.0 g/dL 85.4  86.7  85.7   Hematocrit 36.0 - 46.0 % 45.3  40.2  43.3   Platelets 150 - 400 K/uL 278  225.0  236.0       Latest Ref Rng & Units 10/02/2024   12:22 AM 04/21/2022    4:03 PM 11/26/2020   11:36 AM  CMP  Glucose 70 - 99 mg/dL 863  87  89    BUN 6 - 20 mg/dL 13  11  17    Creatinine 0.44 - 1.00 mg/dL 9.26  9.18  9.16   Sodium 135 - 145 mmol/L 139  141  138   Potassium 3.5 - 5.1 mmol/L 4.3  4.0  4.4   Chloride 98 - 111 mmol/L 105  107  104   CO2 22 - 32 mmol/L 23  27  29    Calcium 8.9 - 10.3 mg/dL 89.8  9.3  9.8   Total Protein 6.5 - 8.1 g/dL 7.3  6.7  7.4   Total Bilirubin 0.0 - 1.2 mg/dL 0.4  0.7  0.6   Alkaline Phos 38 - 126 U/L 81  54  52   AST 15 - 41 U/L 24  19  17    ALT 0 - 44 U/L 16  15  12       Imaging studies:   CT Abdomen/Pelvis (10/02/2024) personally reviewed with large gallstone noted, and radiologist report reviewed below:  IMPRESSION: 1. 4 mm nonobstructive calculus in the lower pole of the left kidney. No evidence of obstructive uropathy. 2. Gallbladder calculus within the neck measuring approximately 2.1 x 1.7 x 2.5 cm with mild gallbladder distention. No pericholecystic fluid or inflammatory change.   RUQ US  (10/03/2023) personally reviewed with large gallstone, no significant changes to suggest cholecystitis, and radiologist report reviewed below:  IMPRESSION: 1. Gallbladder distention with a 2.2 cm stone in the gallbladder neck, most consistent with cholelithiasis with possible intermittent cystic duct obstruction or biliary colic, with early acute cholecystitis considered less likely given absent inflammatory findings. 2. If symptoms persist or there is ongoing concern for acute cholecystitis/cystic duct obstruction, consider hepatobiliary (HIDA) scan.    Assessment/Plan: (ICD-10's: K67.20) 59 y.o. female with symptomatic cholelithiasis   - Will plan for robotic assisted laparoscopic cholecystectomy this afternoon with Dr Jordis pending OR/Anesthesia availability  - All risks,  benefits, and alternatives to above procedure(s) were discussed with the patient and her family, all of their questions were answered to their expressed satisfaction, patient expresses she wishes to proceed, and  informed consent was obtained.    - NPO for planned procedure; IVF support   - IV Abx for surgical prophylaxis - ICG on call to OR - Monitor abdominal examination; on-going bowel function   - Pain control prn; antiemetics prn  - Mobilize as tolerated   - DVT prophylaxis; Hold for surgery  All of the above findings and recommendations were discussed with the patient and her family, and all of their questions were answered to their expressed satisfaction.  -- Arthea Platt, PA-C [facility] 10/02/2024, 8:30 AM M-F: 7am - 4pm     [1] No Known Allergies

## 2024-10-02 NOTE — Op Note (Signed)
 Robotic assisted laparoscopic Cholecystectomy  Pre-operative Diagnosis: acute cholecystitis  Post-operative Diagnosis: same  Procedure:  Robotic assisted laparoscopic Cholecystectomy  Surgeon: Laneta Luna, MD FACS  Anesthesia: Gen. with endotracheal tube  Findings: Acute moderate Cholecystitis with hydrops Critical window of safety  Estimated Blood Loss: 5cc       Specimens: Gallbladder           Complications: none   Procedure Details  The patient was seen again in the Holding Room. The benefits, complications, treatment options, and expected outcomes were discussed with the patient. The risks of bleeding, infection, recurrence of symptoms, failure to resolve symptoms, bile duct damage, bile duct leak, retained common bile duct stone, bowel injury, any of which could require further surgery and/or ERCP, stent, or papillotomy were reviewed with the patient. The likelihood of improving the patient's symptoms with return to their baseline status is good.  The patient and/or family concurred with the proposed plan, giving informed consent.  The patient was taken to Operating Room, identified  and the procedure verified as Laparoscopic Cholecystectomy.  A Time Out was held and the above information confirmed.  Prior to the induction of general anesthesia, antibiotic prophylaxis was administered. VTE prophylaxis was in place. General endotracheal anesthesia was then administered and tolerated well. After the induction, the abdomen was prepped with Chloraprep and draped in the sterile fashion. The patient was positioned in the supine position.  Cut down technique was used to enter the abdominal cavity and a Hasson trochar was placed after two vicryl stitches were anchored to the fascia. Pneumoperitoneum was then created with CO2 and tolerated well without any adverse changes in the patient's vital signs.  Three 8-mm ports were placed under direct vision. All skin incisions  were infiltrated  with a local anesthetic agent before making the incision and placing the trocars.   The patient was positioned  in reverse Trendelenburg, robot was brought to the surgical field and docked in the standard fashion.  We made sure all the instrumentation was kept indirect view at all times and that there were no collision between the arms. I scrubbed out and went to the console.  The gallbladder was identified found inflamed and distended, the fundus grasped and retracted cephalad. Adhesions were lysed bluntly. The infundibulum was grasped and retracted laterally, exposing the peritoneum overlying the triangle of Calot. This was then divided and exposed in a blunt fashion. An extended critical view of the cystic duct and cystic artery was obtained.  The cystic duct was clearly identified and bluntly dissected.   Artery and duct were double clipped and divided. Wall of Gb was very friable and small tear was visualized w evidence of hydrops, It was aspirated and irrigated. Using ICG cholangiography we visualize the cystic duct and CBD w/o evidence of bile injuries. The gallbladder was taken from the gallbladder fossa in a retrograde fashion with the electrocautery.  Hemostasis was achieved with the electrocautery. nspection of the right upper quadrant was performed. No bleeding, bile duct injury or leak, or bowel injury was noted. Robotic instruments and robotic arms were undocked in the standard fashion.  I scrubbed back in.  The gallbladder was removed and placed in an Endocatch bag.   Pneumoperitoneum was released.  The periumbilical port site was closed with interrumpted 0 Vicryl sutures. 4-0 subcuticular Monocryl was used to close the skin. Dermabond was  applied.  The patient was then extubated and brought to the recovery room in stable condition. Sponge, lap, and needle counts  were correct at closure and at the conclusion of the case.               Laneta Luna, MD, FACS

## 2024-10-02 NOTE — Transfer of Care (Signed)
 Immediate Anesthesia Transfer of Care Note  Patient: Hayley Jacobson  Procedure(s) Performed: CHOLECYSTECTOMY, ROBOT-ASSISTED, LAPAROSCOPIC (Abdomen)  Patient Location: PACU  Anesthesia Type:General  Level of Consciousness: drowsy  Airway & Oxygen Therapy: Patient Spontanous Breathing  Post-op Assessment: Report given to RN and Post -op Vital signs reviewed and stable  Post vital signs: Reviewed and stable  Last Vitals:  Vitals Value Taken Time  BP 128/56 10/02/24 18:18  Temp    Pulse 89 10/02/24 18:20  Resp 16 10/02/24 18:20  SpO2 98 % 10/02/24 18:20  Vitals shown include unfiled device data.  Last Pain:  Vitals:   10/02/24 1628  TempSrc:   PainSc: 6       Patients Stated Pain Goal: 0 (10/02/24 1410)  Complications: No notable events documented.

## 2024-10-03 ENCOUNTER — Other Ambulatory Visit: Payer: Self-pay

## 2024-10-03 MED ORDER — IBUPROFEN 600 MG PO TABS
600.0000 mg | ORAL_TABLET | Freq: Four times a day (QID) | ORAL | 0 refills | Status: AC | PRN
  Filled 2024-10-03: qty 30, 8d supply, fill #0

## 2024-10-03 MED ORDER — OXYCODONE HCL 5 MG PO TABS
5.0000 mg | ORAL_TABLET | Freq: Four times a day (QID) | ORAL | 0 refills | Status: AC | PRN
  Filled 2024-10-03: qty 25, 7d supply, fill #0

## 2024-10-03 NOTE — Discharge Instructions (Signed)
In addition to included general post-operative instructions,  Diet: Resume home diet. Recommend avoiding or limiting fatty/greasy foods over the next few days/week. If you do eat these, you may (or may not) notice diarrhea. This is expected while your body adjusts to not having a gallbladder, and it typically resolves with time.    Activity: No heavy lifting >20 pounds (children, pets, laundry, garbage) or strenuous activity for 4 weeks, but light activity and walking are encouraged. Do not drive or drink alcohol if taking narcotic pain medications or having pain that might distract from driving.  Wound care: You may shower/get incision wet with soapy water and pat dry (do not rub incisions), but no baths or submerging incision underwater until follow-up.   Medications: Resume all home medications. For mild to moderate pain: acetaminophen (Tylenol) or ibuprofen/naproxen (if no kidney disease). Combining Tylenol with alcohol can substantially increase your risk of causing liver disease. Narcotic pain medications, if prescribed, can be used for severe pain, though may cause nausea, constipation, and drowsiness. Do not combine Tylenol and Percocet (or similar) within a 6 hour period as Percocet (and similar) contain(s) Tylenol. If you do not need the narcotic pain medication, you do not need to fill the prescription.  Call office [phone] / [phone]) at any time if any questions, worsening pain, fevers/chills, bleeding, drainage from incision site, or other concerns.

## 2024-10-03 NOTE — Discharge Summary (Signed)
 [facility] SURGICAL DISCHARGE SUMMARY  Patient ID: Hayley Jacobson MRN: [id number] DOB/AGE: [birth year] 59 y.o.  Admit date: 10/02/2024 Discharge date: 10/03/2024  Discharge Diagnoses Patient Active Problem List   Diagnosis Date Noted   Symptomatic cholelithiasis 10/02/2024   Cholecystitis, acute 10/02/2024    Consultants None  Procedures 10/02/2024: Robotic assisted laparoscopic cholecystectomy   HPI: 59 y.o. female presented to [facility] ED overnight for abdominal pain and back pain. Patient reports the acute onset of upper back pain last night while making dinner. This radiated to her shoulder and upper abdomen. Ultimately, this was accompanied by nausea and emesis. No fever, chills, CP, SOB, urinary changes, bowel changes, nor juandice. She denied any history of similar or previous known gallbladder disease. No previous abdominal surgeries. Laboratory work up in the ED was reassuring. Renal CT showed large gallstone without significant changes to suggest cholecystitis. RUQ US  was similar.   Hospital Course: Informed consent was obtained and documented, and patient underwent uneventful robotic assisted laparoscopic cholecystectomy (Dr Jordis, 10/02/2024).  Post-operatively, patient's pain/symptoms improved/resolved and advancement of patient's diet and ambulation were well-tolerated. The remainder of patient's hospital course was essentially unremarkable, and discharge planning was initiated accordingly with patient safely able to be discharged home with appropriate discharge instructions, pain control, and outpatient follow-up after all of her questions were answered to her expressed satisfaction.   Discharge Condition: Good   Physical Examination:  Constitutional: Well appearing female; NAD Pulmonary: Normal effort, no respiratory distress Gastrointestinal: Soft, incisional soreness, non-distended, no rebound/guarding  Skin: Laparoscopic incisions are CDI with  dermabond, no erythema    Allergies as of 10/03/2024   No Known Allergies      Medication List     TAKE these medications    CALTRATE 600+D PO Take 1 tablet by mouth daily.   clindamycin -benzoyl peroxide gel Commonly known as: BenzaClin Apply to acne bumps once or twice a day   ESTRADIOL VA Place 1 Applicatorful vaginally 2 (two) times a week.   gabapentin 100 MG capsule Commonly known as: NEURONTIN SMARTSIG:1 Capsule(s) By Mouth Every Night PRN   ibuprofen  600 MG tablet Commonly known as: ADVIL  Take 1 tablet (600 mg total) by mouth every 6 (six) hours as needed.   mupirocin  ointment 2 % Commonly known as: BACTROBAN  Apply 1 application topically daily. Qd to excision site   oxyCODONE  5 MG immediate release tablet Commonly known as: Oxy IR/ROXICODONE  Take 1 tablet (5 mg total) by mouth every 6 (six) hours as needed for severe pain (pain score 7-10) or breakthrough pain.   Vitamin D  (Ergocalciferol ) 1.25 MG (50000 UNIT) Caps capsule Commonly known as: DRISDOL  TAKE 1 CAPSULE BY MOUTH ONE TIME PER WEEK   vitamin D3 50 MCG (2000 UT) Caps Take 1 capsule by mouth daily.   zinc gluconate 50 MG tablet Take 50 mg by mouth daily.          Follow-up Information     Aicha Clingenpeel R, PA-C. Go on 10/17/2024.   Specialty: Physician Assistant Why: Go to appointment on 01/28 at 345 PM Contact information: [address] [place] [address] [phone]                  Time spent on discharge management including discussion of hospital course, clinical condition, outpatient instructions, prescriptions, and follow up with the patient and members of the medical team: >30 minutes  -- Hayley Jacobson , PA-C [facility]  10/03/2024, 8:18 AM [phone] M-F: 7am - 4pm

## 2024-10-04 LAB — SURGICAL PATHOLOGY

## 2024-10-04 NOTE — Anesthesia Postprocedure Evaluation (Signed)
"   Anesthesia Post Note  Patient: Hayley Jacobson  Procedure(s) Performed: CHOLECYSTECTOMY, ROBOT-ASSISTED, LAPAROSCOPIC (Abdomen)  Patient location during evaluation: PACU Anesthesia Type: General Level of consciousness: awake and alert Pain management: pain level controlled Vital Signs Assessment: post-procedure vital signs reviewed and stable Respiratory status: spontaneous breathing, nonlabored ventilation, respiratory function stable and patient connected to nasal cannula oxygen Cardiovascular status: blood pressure returned to baseline and stable Postop Assessment: no apparent nausea or vomiting Anesthetic complications: no   No notable events documented.   Last Vitals:  Vitals:   10/03/24 0242 10/03/24 0823  BP: (!) 99/59 105/62  Pulse: 72 66  Resp: 17 18  Temp: 37 C 36.9 C  SpO2: 95% 97%    Last Pain:  Vitals:   10/03/24 0814  TempSrc:   PainSc: 3                  [name]      "

## 2024-10-17 ENCOUNTER — Encounter: Payer: Self-pay | Admitting: Physician Assistant

## 2024-10-17 ENCOUNTER — Ambulatory Visit: Admitting: Physician Assistant

## 2024-10-17 VITALS — BP 104/72 | HR 75 | Ht 63.0 in | Wt 150.0 lb

## 2024-10-17 DIAGNOSIS — Z09 Encounter for follow-up examination after completed treatment for conditions other than malignant neoplasm: Secondary | ICD-10-CM

## 2024-10-17 DIAGNOSIS — K8020 Calculus of gallbladder without cholecystitis without obstruction: Secondary | ICD-10-CM

## 2024-10-17 DIAGNOSIS — K810 Acute cholecystitis: Secondary | ICD-10-CM

## 2024-10-17 NOTE — Progress Notes (Signed)
 [facility] POST-OP OFFICE VISIT  10/17/2024  HPI: Hayley Jacobson is a 59 y.o. female 15 days s/p robotic assisted laparoscopic cholecystectomy for acute cholecystitis   She is doing well  She was sore for the first few days; only needed oxycodone  at night  No longer needing pain medications No fever, chills, nausea, emesis She is tolerating PO; no diarrhea Incisions are healing well No other complaints   Vital signs: BP 104/72   Pulse 75   Ht 5' 3 (1.6 m)   Wt 150 lb (68 kg)   SpO2 99%   BMI 26.57 kg/m    Physical Exam: Constitutional: Well appearing female, NAD Abdomen: Soft, non-tender, non-distended, no rebound/guarding Skin: Laparoscopic incisions are healing well, no erythema or drainage   Assessment/Plan: This is a 59 y.o. female 15 days s/p robotic assisted laparoscopic cholecystectomy for acute cholecystitis    - Pain control prn  - Reviewed wound care recommendation  - Reviewed lifting restrictions; 4 weeks total  - Reviewed surgical pathology; Acute, Subacute, and Chronic Cholecystitis   - She can follow up on as needed basis; She understands to call with questions/concerns  -- Arthea Platt, PA-C [facility] 10/17/2024, 4:09 PM M-F: 7am - 4pm

## 2024-10-17 NOTE — Patient Instructions (Signed)

## 2025-10-03 ENCOUNTER — Ambulatory Visit: Admitting: Dermatology
